# Patient Record
Sex: Female | Born: 1995 | Hispanic: No | Marital: Single | Smoking: Former smoker
Health system: Southern US, Community
[De-identification: ages and names within clinical notes are randomized; demographics above are authoritative.]

## PROBLEM LIST (undated history)

## (undated) ENCOUNTER — Inpatient Hospital Stay (HOSPITAL_COMMUNITY): Payer: Self-pay

## (undated) DIAGNOSIS — Z789 Other specified health status: Secondary | ICD-10-CM

---
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
## 2014-05-22 ENCOUNTER — Encounter (HOSPITAL_COMMUNITY): Payer: Self-pay | Admitting: *Deleted

## 2014-05-22 ENCOUNTER — Emergency Department (HOSPITAL_COMMUNITY)
Admission: EM | Admit: 2014-05-22 | Discharge: 2014-05-22 | Disposition: A | Discharge status: Home / Self Care | Payer: BLUE CROSS/BLUE SHIELD | Attending: Emergency Medicine | Admitting: Emergency Medicine

## 2014-05-22 DIAGNOSIS — N390 Urinary tract infection, site not specified: Secondary | ICD-10-CM | POA: Insufficient documentation

## 2014-05-22 DIAGNOSIS — N939 Abnormal uterine and vaginal bleeding, unspecified: Secondary | ICD-10-CM

## 2014-05-22 DIAGNOSIS — Z3202 Encounter for pregnancy test, result negative: Secondary | ICD-10-CM | POA: Diagnosis not present

## 2014-05-22 DIAGNOSIS — R1030 Lower abdominal pain, unspecified: Secondary | ICD-10-CM | POA: Diagnosis present

## 2014-05-22 DIAGNOSIS — Z87891 Personal history of nicotine dependence: Secondary | ICD-10-CM | POA: Insufficient documentation

## 2014-05-22 DIAGNOSIS — Z792 Long term (current) use of antibiotics: Secondary | ICD-10-CM | POA: Diagnosis not present

## 2014-05-22 LAB — URINALYSIS, ROUTINE W REFLEX MICROSCOPIC
Bilirubin Urine: NEGATIVE
Glucose, UA: NEGATIVE mg/dL
Ketones, ur: NEGATIVE mg/dL
Nitrite: NEGATIVE
PH: 6 (ref 5.0–8.0)
Protein, ur: NEGATIVE mg/dL
SPECIFIC GRAVITY, URINE: 1.026 (ref 1.005–1.030)
Urobilinogen, UA: 0.2 mg/dL (ref 0.0–1.0)

## 2014-05-22 LAB — WET PREP, GENITAL
TRICH WET PREP: NONE SEEN
YEAST WET PREP: NONE SEEN

## 2014-05-22 LAB — POC URINE PREG, ED: Preg Test, Ur: NEGATIVE

## 2014-05-22 LAB — URINE MICROSCOPIC-ADD ON

## 2014-05-22 MED ORDER — CEFTRIAXONE SODIUM 250 MG IJ SOLR
250.0000 mg | Freq: Once | INTRAMUSCULAR | Status: AC
  Administered 2014-05-22: 250 mg via INTRAMUSCULAR
  Filled 2014-05-22: qty 250

## 2014-05-22 MED ORDER — LIDOCAINE HCL (PF) 1 % IJ SOLN
2.0000 mL | Freq: Once | INTRAMUSCULAR | Status: AC
  Administered 2014-05-22: 2 mL

## 2014-05-22 MED ORDER — KETOROLAC TROMETHAMINE 30 MG/ML IJ SOLN
30.0000 mg | Freq: Once | INTRAMUSCULAR | Status: AC
  Administered 2014-05-22: 30 mg via INTRAVENOUS
  Filled 2014-05-22: qty 1

## 2014-05-22 MED ORDER — SODIUM CHLORIDE 0.9 % IV BOLUS (SEPSIS)
1000.0000 mL | INTRAVENOUS | Status: AC
  Administered 2014-05-22: 1000 mL via INTRAVENOUS

## 2014-05-22 MED ORDER — PHENAZOPYRIDINE HCL 200 MG PO TABS: 200.0000 mg | ORAL_TABLET | Freq: Three times a day (TID) | ORAL | Status: DC

## 2014-05-22 MED ORDER — LIDOCAINE HCL (PF) 1 % IJ SOLN
INTRAMUSCULAR | Status: AC
  Filled 2014-05-22: qty 5

## 2014-05-22 MED ORDER — CEPHALEXIN 500 MG PO CAPS: 500.0000 mg | ORAL_CAPSULE | Freq: Four times a day (QID) | ORAL | Status: DC

## 2014-05-22 MED ORDER — AZITHROMYCIN 250 MG PO TABS
1000.0000 mg | ORAL_TABLET | Freq: Once | ORAL | Status: AC
  Administered 2014-05-22: 1000 mg via ORAL
  Filled 2014-05-22: qty 4

## 2014-05-22 MED ORDER — ONDANSETRON HCL 4 MG/2ML IJ SOLN
4.0000 mg | INTRAMUSCULAR | Status: AC
  Administered 2014-05-22: 4 mg via INTRAVENOUS
  Filled 2014-05-22: qty 2

## 2014-05-22 NOTE — ED Provider Notes (Signed)
CSN: [id number]     Arrival date & time 05/22/14  1225 History   First MD Initiated Contact with Patient 05/22/14 1346     Chief Complaint  Patient presents with  . Abdominal Pain   (Consider location/radiation/quality/duration/timing/severity/associated sxs/prior Treatment) HPI  Joanne Espinoza is a 19 yo female presenting with report of abd pain.  She states her pain began about 1 week ago and has been constant, describes it as lower abd pain and rates it currently as a 6/10.  She also reports blood clots noted in urine since the pain began.  Her LMP was at the beginning of the month.  She endorses some dysuria and painful intercourse.  Her bowel movements have been soft and normal but she took a laxative the last 2 days to help with abd pain in case she was constipated.  She subsequently had a large watery stool but the pain has not improved.  She reports some nausea but denies vomiting, vaginal discharge, back pain, fevers or chills.   History reviewed. No pertinent past medical history. History reviewed. No pertinent past surgical history. No family history on file. History  Substance Use Topics  . Smoking status: Former Games developer  . Smokeless tobacco: Never Used  . Alcohol Use: Yes   OB History    No data available     Review of Systems  Constitutional: Negative for fever and chills.  HENT: Negative for sore throat.   Eyes: Negative for visual disturbance.  Respiratory: Negative for cough and shortness of breath.   Cardiovascular: Negative for chest pain and leg swelling.  Gastrointestinal: Positive for nausea and abdominal pain. Negative for vomiting and diarrhea.  Genitourinary: Positive for hematuria and dyspareunia. Negative for dysuria.  Musculoskeletal: Negative for myalgias.  Skin: Negative for rash.  Neurological: Negative for weakness, numbness and headaches.    Allergies  Review of patient's allergies indicates no known allergies.  Home Medications   Prior to  Admission medications   Medication Sig Start Date End Date Taking? Authorizing Provider  cephALEXin (KEFLEX) 500 MG capsule Take 1 capsule (500 mg total) by mouth 4 (four) times daily. 04/06/13   Hannah Muthersbaugh, PA-C  phenazopyridine (PYRIDIUM) 200 MG tablet Take 1 tablet (200 mg total) by mouth 3 (three) times daily as needed for pain. 04/06/13   Hannah Muthersbaugh, PA-C   BP 131/77 mmHg  Pulse 83  Temp(Src) 98.6 F (37 C) (Oral)  Resp 18  Ht  (1.6 m)  Wt 120 lb (54.432 kg)  BMI 21.26 kg/m2  SpO2 100% Physical Exam  Constitutional: She appears well-developed and well-nourished. No distress.  HENT:  Head: Normocephalic and atraumatic.  Mouth/Throat: Oropharynx is clear and moist.  Eyes: Conjunctivae are normal.  Neck: Neck supple.  Cardiovascular: Normal rate, regular rhythm and intact distal pulses.   Pulmonary/Chest: Effort normal and breath sounds normal. No respiratory distress. She has no wheezes. She has no rales. She exhibits no tenderness.  Abdominal: Soft. Normal appearance and bowel sounds are normal. She exhibits no distension and no mass. There is no hepatosplenomegaly. There is tenderness in the suprapubic area. There is no rigidity, no rebound, no guarding, no CVA tenderness, no tenderness at McBurney's point and negative Murphy's sign.    Genitourinary: Cervix exhibits no motion tenderness. Right adnexum displays no tenderness. Left adnexum displays no tenderness. There is bleeding in the vagina.  Bleeding noted in vaginal vault.   Musculoskeletal: She exhibits no tenderness.  Lymphadenopathy:    She has no cervical  adenopathy.  Neurological: She is alert.  Skin: Skin is warm and dry. No rash noted. She is not diaphoretic.  Psychiatric: She has a normal mood and affect.  Nursing note and vitals reviewed.   ED Course  Procedures (including critical care time) Labs Review Labs Reviewed  URINALYSIS, ROUTINE W REFLEX MICROSCOPIC - Abnormal; Notable for the  following:    Hgb urine dipstick MODERATE (*)    Leukocytes, UA SMALL (*)    All other components within normal limits  URINE MICROSCOPIC-ADD ON - Abnormal; Notable for the following:    Squamous Epithelial / LPF FEW (*)    Bacteria, UA FEW (*)    All other components within normal limits  WET PREP, GENITAL  POC URINE PREG, ED  GC/CHLAMYDIA PROBE AMP ([place])    Imaging Review No results found.   EKG Interpretation None      MDM   Final diagnoses:  UTI (lower urinary tract infection)  Vaginal bleeding   19 yo with report of abd pain and hematuria, however there is vaginal blood noted on pelvic exam. Despite report of painful intercourse, no CMT on exam. Her pregnancy test is negative so no concern for ectopic pregnancy. She has 11-20 WBC in her urine so a prescription for abx for UTI is provided, however she was also treated with azithromycin and rocephin due to wet prep with increased WBCs. Pt not concerning for PID because hemodynamically stable and no cervical motion tenderness on pelvic exam. Patient to be discharged with instructions to follow up with OBGYN. Discussed importance of using protection when sexually active. Pt understands that they have GC/Chlamydia cultures pending and that they will need to inform all sexual partners if results return positive. Pt is well-appearing, in no acute distress and vital signs reviewed and not concerning. She appears safe to be discharged.  Return precautions provided. Pt aware of plan and in agreement.    Filed Vitals:   05/22/14 1259 05/22/14 1418 05/22/14 1545 05/22/14 1600  BP: 131/77 133/77 111/76 110/64  Pulse: 83 89 81 154  Temp: 98.6 F (37 C)     TempSrc: Oral     Resp: 18 18    Height: 5\' 3"  (1.6 m)     Weight: 120 lb (54.432 kg)     SpO2: 100% 100% 100% 100%   Meds given in ED:  Medications  sodium chloride 0.9 % bolus 1,000 mL (0 mLs Intravenous Stopped 05/22/14 1554)  ketorolac (TORADOL) 30 MG/ML injection 30  mg (30 mg Intravenous Given 05/22/14 1431)  ondansetron (ZOFRAN) injection 4 mg (4 mg Intravenous Given 05/22/14 1431)  cefTRIAXone (ROCEPHIN) injection 250 mg (250 mg Intramuscular Given 05/22/14 1550)  azithromycin (ZITHROMAX) tablet 1,000 mg (1,000 mg Oral Given 05/22/14 1548)  lidocaine (PF) (XYLOCAINE) 1 % injection 2 mL (2 mLs Other Given 05/22/14 1552)    Discharge Medication List as of 05/22/2014  4:24 PM         Harle BattiestElizabeth Beckham Buxbaum, NP 05/24/14 0234  Richardean Canalavid H Yao, MD 05/25/14 1046

## 2014-05-22 NOTE — ED Notes (Signed)
Pt state that she has had abdominal pain, blood clots with urination. Pt denies being on period or vaginal discharge. Pt reports unprotected sex recently.

## 2014-05-22 NOTE — ED Notes (Signed)
Pelvic cart set up at bedside. Pt undressed from wast down.

## 2014-05-22 NOTE — Discharge Instructions (Signed)
Please follow directions provided. Be sure to call and make an appointment to follow-up with the women's outpatient clinic for further evaluation. Please take your antibiotic until they're all gone to treat the infection in your urine. Be sure to drink clear fluids to stay well hydrated. Don't hesitate to return for any new, worsening, or concerning symptoms.   SEEK IMMEDIATE MEDICAL CARE IF:  You have severe back pain or lower abdominal pain.  You develop chills.  You have nausea or vomiting.  You have continued burning or discomfort with urination.

## 2014-05-23 LAB — GC/CHLAMYDIA PROBE AMP (~~LOC~~) NOT AT ARMC
CHLAMYDIA, DNA PROBE: POSITIVE — AB
NEISSERIA GONORRHEA: NEGATIVE

## 2014-05-24 ENCOUNTER — Telehealth (HOSPITAL_BASED_OUTPATIENT_CLINIC_OR_DEPARTMENT_OTHER): Payer: Self-pay | Admitting: Emergency Medicine

## 2014-05-24 LAB — URINE CULTURE
Colony Count: 40000
Special Requests: NORMAL

## 2014-05-26 ENCOUNTER — Telehealth (HOSPITAL_BASED_OUTPATIENT_CLINIC_OR_DEPARTMENT_OTHER): Payer: Self-pay | Admitting: Emergency Medicine

## 2014-06-13 ENCOUNTER — Telehealth (HOSPITAL_BASED_OUTPATIENT_CLINIC_OR_DEPARTMENT_OTHER): Payer: Self-pay | Admitting: Emergency Medicine

## 2014-06-13 NOTE — Telephone Encounter (Signed)
Lost to followup 

## 2014-08-30 ENCOUNTER — Emergency Department (HOSPITAL_COMMUNITY)
Admission: EM | Admit: 2014-08-30 | Discharge: 2014-08-31 | Disposition: A | Discharge status: Home / Self Care | Payer: BLUE CROSS/BLUE SHIELD | Attending: Emergency Medicine | Admitting: Emergency Medicine

## 2014-08-30 ENCOUNTER — Encounter (HOSPITAL_COMMUNITY): Payer: Self-pay | Admitting: Emergency Medicine

## 2014-08-30 DIAGNOSIS — Z87891 Personal history of nicotine dependence: Secondary | ICD-10-CM | POA: Insufficient documentation

## 2014-08-30 DIAGNOSIS — O2341 Unspecified infection of urinary tract in pregnancy, first trimester: Secondary | ICD-10-CM | POA: Diagnosis not present

## 2014-08-30 DIAGNOSIS — Z202 Contact with and (suspected) exposure to infections with a predominantly sexual mode of transmission: Secondary | ICD-10-CM | POA: Insufficient documentation

## 2014-08-30 DIAGNOSIS — Z3A11 11 weeks gestation of pregnancy: Secondary | ICD-10-CM | POA: Diagnosis not present

## 2014-08-30 DIAGNOSIS — O9989 Other specified diseases and conditions complicating pregnancy, childbirth and the puerperium: Secondary | ICD-10-CM | POA: Diagnosis present

## 2014-08-30 DIAGNOSIS — Z79899 Other long term (current) drug therapy: Secondary | ICD-10-CM | POA: Insufficient documentation

## 2014-08-30 DIAGNOSIS — N390 Urinary tract infection, site not specified: Secondary | ICD-10-CM

## 2014-08-30 DIAGNOSIS — O2342 Unspecified infection of urinary tract in pregnancy, second trimester: Secondary | ICD-10-CM

## 2014-08-30 LAB — URINALYSIS, ROUTINE W REFLEX MICROSCOPIC
Bilirubin Urine: NEGATIVE
Glucose, UA: NEGATIVE mg/dL
Hgb urine dipstick: NEGATIVE
Ketones, ur: NEGATIVE mg/dL
Nitrite: NEGATIVE
Protein, ur: NEGATIVE mg/dL
Specific Gravity, Urine: 1.027 (ref 1.005–1.030)
Urobilinogen, UA: 0.2 mg/dL (ref 0.0–1.0)
pH: 7.5 (ref 5.0–8.0)

## 2014-08-30 LAB — WET PREP, GENITAL
Trich, Wet Prep: NONE SEEN
Yeast Wet Prep HPF POC: NONE SEEN

## 2014-08-30 LAB — URINE MICROSCOPIC-ADD ON

## 2014-08-30 MED ORDER — CEPHALEXIN 500 MG PO CAPS: 1000.0000 mg | ORAL_CAPSULE | Freq: Two times a day (BID) | ORAL | Status: DC

## 2014-08-30 NOTE — ED Notes (Signed)
Pelvic cart at bedside. 

## 2014-08-30 NOTE — ED Notes (Signed)
Pt arrived to the ED with a complaint of an sexually transmitted disease.  Pt states she is three month pregnant and went to the doctors a week ago and was told that she had chlamydia.  Pt was advised to seek a second opinion.  Pt has no symptoms at this time.

## 2014-08-30 NOTE — ED Provider Notes (Signed)
CSN: [id number]     Arrival date & time 08/30/14  2008 History   First MD Initiated Contact with Patient 08/30/14 2136     Chief Complaint  Patient presents with  . SEXUALLY TRANSMITTED DISEASE     (Consider location/radiation/quality/duration/timing/severity/associated sxs/prior Treatment) HPI   19 year old G1 P0 pregnant female presenting with concern of STD. Patient states she is 3 months pregnant, last menstrual period was May 13. She does have an OB/GYN. She has had a formal ultrasound confirming IUP according to patient. 4 days ago she was seen at her OB/GYN office for a regular checkup. She was told that she was diagnosed with Chlamydia. She was given antibiotic treatment. Patient states she has no symptoms and she is requesting for a second opinion. She denies having fever, chills, chest pain, shortness of breath, abdominal pain, back pain, dysuria, hematuria, vaginal bleeding, vaginal discharge, or rash. No prior history of STDs. Denies any pain with sexual activities.  No past medical history on file. No past surgical history on file. No family history on file. History  Substance Use Topics  . Smoking status: Former Games developer  . Smokeless tobacco: Never Used  . Alcohol Use: Yes   OB History    Gravida Para Term Preterm AB TAB SAB Ectopic Multiple Living   1              Review of Systems  All other systems reviewed and are negative.     Allergies  Review of patient's allergies indicates no known allergies.  Home Medications   Prior to Admission medications   Medication Sig Start Date End Date Taking? Authorizing Provider  prenatal vitamin w/FE, FA (PRENATAL 1 + 1) 27-1 MG TABS tablet Take 1 tablet by mouth daily at 12 noon.   Yes Historical Provider, MD  cephALEXin (KEFLEX) 500 MG capsule Take 1 capsule (500 mg total) by mouth 4 (four) times daily. Patient not taking: Reported on 08/30/2014 05/22/14   Harle Battiest, NP  phenazopyridine (PYRIDIUM) 200 MG tablet  Take 1 tablet (200 mg total) by mouth 3 (three) times daily. Patient not taking: Reported on 08/30/2014 05/22/14   Harle Battiest, NP   Ht  (1.6 m)  Wt 126 lb (57.153 kg)  BMI 22.33 kg/m2 Physical Exam  Constitutional: She appears well-developed and well-nourished. No distress.  HENT:  Head: Atraumatic.  Eyes: Conjunctivae are normal.  Neck: Neck supple.  Genitourinary:  Chaperone present during exam. No inguinal lymphadenopathy or inguinal hernia noted. Normal external genitalia. Mild discomfort with speculum insertion. Functional vaginal discharge noted in vaginal vault. Closed cervical os free of lesion. On bimanual examination, no adnexal tenderness or cervical motion tenderness. No mass.  Neurological: She is alert.  Skin: No rash noted.  Psychiatric: She has a normal mood and affect.  Nursing note and vitals reviewed.   ED Course  Procedures (including critical care time)  Patient here requesting for second opinion of recently diagnosed to chlamydial infection treated with antibiotic. I explained to patient that chlamydial infection is usually asymptomatic. However patient continued to request for further STD check.  11:44 PM No evidence of PID on pelvic examination. Her urine shows small leukocytes, 11-20 WBC, and a few bacteria. Since patient is pregnant, and to treat for asymptomatic bacteriuria. Wet prep with may WBC and a few clue cells. Patient has already received treatment for chlamydia infection. Culture sent. Recommend patient to follow-up with her OB/GYN for further care.   Labs Review Labs Reviewed  WET PREP,  GENITAL - Abnormal; Notable for the following:    Clue Cells Wet Prep HPF POC FEW (*)    WBC, Wet Prep HPF POC MANY (*)    All other components within normal limits  URINALYSIS, ROUTINE W REFLEX MICROSCOPIC (NOT AT [facility]) - Abnormal; Notable for the following:    APPearance CLOUDY (*)    Leukocytes, UA SMALL (*)    All other components within normal  limits  URINE MICROSCOPIC-ADD ON - Abnormal; Notable for the following:    Squamous Epithelial / LPF FEW (*)    Bacteria, UA FEW (*)    All other components within normal limits  RPR  HIV ANTIBODY (ROUTINE TESTING)  HCG, QUANTITATIVE, PREGNANCY  GC/CHLAMYDIA PROBE AMP ([place]) NOT AT [facility]    Imaging Review No results found.   EKG Interpretation None      MDM   Final diagnoses:  Asymptomatic bacteriuria during pregnancy, second trimester    BP 116/58 mmHg  Pulse 73  Temp(Src) 97.9 F (36.6 C) (Oral)  Resp 16  Ht 5\' 3"  (1.6 m)  Wt 126 lb (57.153 kg)  BMI 22.33 kg/m2  SpO2 100%     Fayrene Helper, PA-C 08/30/14 2346  Toy Cookey, MD 08/31/14 0004

## 2014-08-30 NOTE — Discharge Instructions (Signed)
Pregnancy and Urinary Tract Infection °A urinary tract infection (UTI) is a bacterial infection of the urinary tract. Infection of the urinary tract can include the ureters, kidneys (pyelonephritis), bladder (cystitis), and urethra (urethritis). All pregnant women should be screened for bacteria in the urinary tract. Identifying and treating a UTI will decrease the risk of preterm labor and developing more serious infections in both the mother and baby. °CAUSES °Bacteria germs cause almost all UTIs.  °RISK FACTORS °Many factors can increase your chances of getting a UTI during pregnancy. These include: °· Having a short urethra. °· Poor toilet and hygiene habits. °· Sexual intercourse. °· Blockage of urine along the urinary tract. °· Problems with the pelvic muscles or nerves. °· Diabetes. °· Obesity. °· Bladder problems after having several children. °· Previous history of UTI. °SIGNS AND SYMPTOMS  °· Pain, burning, or a stinging feeling when urinating. °· Suddenly feeling the need to urinate right away (urgency). °· Loss of bladder control (urinary incontinence). °· Frequent urination, more than is common with pregnancy. °· Lower abdominal or back discomfort. °· Cloudy urine. °· Blood in the urine (hematuria). °· Fever.  °When the kidneys are infected, the symptoms may be: °· Back pain. °· Flank pain on the right side more so than the left. °· Fever. °· Chills. °· Nausea. °· Vomiting. °DIAGNOSIS  °A urinary tract infection is usually diagnosed through urine tests. Additional tests and procedures are sometimes done. These may include: °· Ultrasound exam of the kidneys, ureters, bladder, and urethra. °· Looking in the bladder with a lighted tube (cystoscopy). °TREATMENT °Typically, UTIs can be treated with antibiotic medicines.  °HOME CARE INSTRUCTIONS  °· Only take over-the-counter or prescription medicines as directed by your health care provider. If you were prescribed antibiotics, take them as directed. Finish  them even if you start to feel better. °· Drink enough fluids to keep your urine clear or pale yellow. °· Do not have sexual intercourse until the infection is gone and your health care provider says it is okay. °· Make sure you are tested for UTIs throughout your pregnancy. These infections often come back.  °Preventing a UTI in the Future °· Practice good toilet habits. Always wipe from front to back. Use the tissue only once. °· Do not hold your urine. Empty your bladder as soon as possible when the urge comes. °· Do not douche or use deodorant sprays. °· Wash with soap and warm water around the genital area and the anus. °· Empty your bladder before and after sexual intercourse. °· Wear underwear with a cotton crotch. °· Avoid caffeine and carbonated drinks. They can irritate the bladder. °· Drink cranberry juice or take cranberry pills. This may decrease the risk of getting a UTI. °· Do not drink alcohol. °· Keep all your appointments and tests as scheduled.  °SEEK MEDICAL CARE IF:  °· Your symptoms get worse. °· You are still having fevers 2 or more days after treatment begins. °· You have a rash. °· You feel that you are having problems with medicines prescribed. °· You have abnormal vaginal discharge. °SEEK IMMEDIATE MEDICAL CARE IF:  °· You have back or flank pain. °· You have chills. °· You have blood in your urine. °· You have nausea and vomiting. °· You have contractions of your uterus. °· You have a gush of fluid from the vagina. °MAKE SURE YOU: °· Understand these instructions.   °· Will watch your condition.   °· Will get help right away if you are not doing   well or get worse.   °Document Released: 05/16/2010 Document Revised: 11/09/2012 Document Reviewed: 08/18/2012 °[employer]® Patient Information ©2015 [employer]. This information is not intended to replace advice given to you by your health care provider. Make sure you discuss any questions you have with your health care provider. ° °

## 2014-08-31 LAB — HCG, QUANTITATIVE, PREGNANCY: hCG, Beta Chain, Quant, S: 121006 m[IU]/mL — ABNORMAL HIGH (ref <5)

## 2014-08-31 LAB — GC/CHLAMYDIA PROBE AMP (~~LOC~~) NOT AT ARMC
Chlamydia: POSITIVE — AB
NEISSERIA GONORRHEA: NEGATIVE

## 2014-08-31 LAB — HIV ANTIBODY (ROUTINE TESTING W REFLEX): HIV Screen 4th Generation wRfx: NONREACTIVE

## 2014-08-31 LAB — RPR: RPR Ser Ql: NONREACTIVE

## 2014-09-03 ENCOUNTER — Telehealth (HOSPITAL_COMMUNITY): Payer: Self-pay

## 2014-09-03 NOTE — Telephone Encounter (Signed)
Positive for chlamydia. Chart sent to edp office for review 

## 2014-09-05 ENCOUNTER — Telehealth (HOSPITAL_COMMUNITY): Payer: Self-pay

## 2014-09-05 NOTE — Telephone Encounter (Signed)
Cart reviewed by Dr Silverio Lay "Azithromycin 1 gram po x 1" Partner needs treatment" 09/05/2014 @ 13:56 LVM requesting callback.

## 2014-09-06 ENCOUNTER — Telehealth (HOSPITAL_COMMUNITY): Payer: Self-pay

## 2014-09-09 ENCOUNTER — Telehealth (HOSPITAL_COMMUNITY): Payer: Self-pay | Admitting: Emergency Medicine

## 2014-09-09 NOTE — Telephone Encounter (Signed)
Post ED Visit - Positive Culture Follow-up: Unsuccessful Patient Follow-up   Positive Chlamydia culture   Patient discharged without antimicrobial prescription and treatment is now indicated  Organism is resistant to prescribed ED discharge antimicrobial  Patient with positive blood cultures   Unable to contact patient after 3 attempts, letter will be sent to address on file  Joanne Espinoza 09/09/2014, 6:19 PM

## 2014-09-24 ENCOUNTER — Telehealth (HOSPITAL_COMMUNITY): Payer: Self-pay

## 2014-09-24 NOTE — Telephone Encounter (Signed)
Unable to contact pt by mail or telephone. Unable to communicate lab results or treatment changes. 

## 2015-06-24 ENCOUNTER — Telehealth (HOSPITAL_BASED_OUTPATIENT_CLINIC_OR_DEPARTMENT_OTHER): Payer: Self-pay | Admitting: Emergency Medicine

## 2015-06-24 NOTE — Telephone Encounter (Signed)
Letter returned, no known forwarding address,, lost to sender

## 2016-01-01 ENCOUNTER — Encounter (HOSPITAL_COMMUNITY): Payer: Self-pay | Admitting: *Deleted

## 2016-01-01 ENCOUNTER — Inpatient Hospital Stay (HOSPITAL_COMMUNITY)
Admission: AD | Admit: 2016-01-01 | Discharge: 2016-01-01 | Disposition: A | Discharge status: Home / Self Care | Payer: Medicaid Other | Source: Ambulatory Visit | Attending: Obstetrics & Gynecology | Admitting: Obstetrics & Gynecology

## 2016-01-01 ENCOUNTER — Inpatient Hospital Stay (HOSPITAL_COMMUNITY): Payer: Medicaid Other

## 2016-01-01 DIAGNOSIS — Z3A01 Less than 8 weeks gestation of pregnancy: Secondary | ICD-10-CM | POA: Diagnosis not present

## 2016-01-01 DIAGNOSIS — R102 Pelvic and perineal pain: Secondary | ICD-10-CM | POA: Insufficient documentation

## 2016-01-01 DIAGNOSIS — Z87891 Personal history of nicotine dependence: Secondary | ICD-10-CM | POA: Insufficient documentation

## 2016-01-01 DIAGNOSIS — Z3201 Encounter for pregnancy test, result positive: Secondary | ICD-10-CM | POA: Insufficient documentation

## 2016-01-01 DIAGNOSIS — O26891 Other specified pregnancy related conditions, first trimester: Secondary | ICD-10-CM | POA: Diagnosis present

## 2016-01-01 DIAGNOSIS — Z3490 Encounter for supervision of normal pregnancy, unspecified, unspecified trimester: Secondary | ICD-10-CM

## 2016-01-01 LAB — CBC
HCT: 37.2 % (ref 36.0–46.0)
HEMOGLOBIN: 12.8 g/dL (ref 12.0–15.0)
MCH: 32.4 pg (ref 26.0–34.0)
MCHC: 34.4 g/dL (ref 30.0–36.0)
MCV: 94.2 fL (ref 78.0–100.0)
Platelets: 317 10*3/uL (ref 150–400)
RBC: 3.95 MIL/uL (ref 3.87–5.11)
RDW: 13.3 % (ref 11.5–15.5)
WBC: 7.7 10*3/uL (ref 4.0–10.5)

## 2016-01-01 LAB — URINALYSIS, ROUTINE W REFLEX MICROSCOPIC
Bilirubin Urine: NEGATIVE
Glucose, UA: NEGATIVE mg/dL
Ketones, ur: NEGATIVE mg/dL
LEUKOCYTES UA: NEGATIVE
Nitrite: NEGATIVE
Protein, ur: NEGATIVE mg/dL
pH: 5 (ref 5.0–8.0)

## 2016-01-01 LAB — WET PREP, GENITAL
Clue Cells Wet Prep HPF POC: NONE SEEN
SPERM: NONE SEEN
Trich, Wet Prep: NONE SEEN
YEAST WET PREP: NONE SEEN

## 2016-01-01 LAB — HCG, QUANTITATIVE, PREGNANCY: hCG, Beta Chain, Quant, S: 120606 m[IU]/mL — ABNORMAL HIGH (ref <5)

## 2016-01-01 LAB — URINE MICROSCOPIC-ADD ON

## 2016-01-01 LAB — POCT PREGNANCY, URINE: Preg Test, Ur: POSITIVE — AB

## 2016-01-01 NOTE — Discharge Instructions (Signed)
First Trimester of Pregnancy °The first trimester of pregnancy is from week 1 until the end of week 12 (months 1 through 3). A week after a sperm fertilizes an egg, the egg will implant on the wall of the uterus. This embryo will begin to develop into a baby. Genes from you and your partner are forming the baby. The female genes determine whether the baby is a boy or a girl. At 6-8 weeks, the eyes and face are formed, and the heartbeat can be seen on ultrasound. At the end of 12 weeks, all the baby's organs are formed.  °Now that you are pregnant, you will want to do everything you can to have a healthy baby. Two of the most important things are to get good prenatal care and to follow your health care provider's instructions. Prenatal care is all the medical care you receive before the baby's birth. This care will help prevent, find, and treat any problems during the pregnancy and childbirth. °BODY CHANGES °Your body goes through many changes during pregnancy. The changes vary from woman to woman.  °· You may gain or lose a couple of pounds at first. °· You may feel sick to your stomach (nauseous) and throw up (vomit). If the vomiting is uncontrollable, call your health care provider. °· You may tire easily. °· You may develop headaches that can be relieved by medicines approved by your health care provider. °· You may urinate more often. Painful urination may mean you have a bladder infection. °· You may develop heartburn as a result of your pregnancy. °· You may develop constipation because certain hormones are causing the muscles that push waste through your intestines to slow down. °· You may develop hemorrhoids or swollen, bulging veins (varicose veins). °· Your breasts may begin to grow larger and become tender. Your nipples may stick out more, and the tissue that surrounds them (areola) may become darker. °· Your gums may bleed and may be sensitive to brushing and flossing. °· Dark spots or blotches (chloasma,  mask of pregnancy) may develop on your face. This will likely fade after the baby is born. °· Your menstrual periods will stop. °· You may have a loss of appetite. °· You may develop cravings for certain kinds of food. °· You may have changes in your emotions from day to day, such as being excited to be pregnant or being concerned that something may go wrong with the pregnancy and baby. °· You may have more vivid and strange dreams. °· You may have changes in your hair. These can include thickening of your hair, rapid growth, and changes in texture. Some women also have hair loss during or after pregnancy, or hair that feels dry or thin. Your hair will most likely return to normal after your baby is born. °WHAT TO EXPECT AT YOUR PRENATAL VISITS °During a routine prenatal visit: °· You will be weighed to make sure you and the baby are growing normally. °· Your blood pressure will be taken. °· Your abdomen will be measured to track your baby's growth. °· The fetal heartbeat will be listened to starting around week 10 or 12 of your pregnancy. °· Test results from any previous visits will be discussed. °Your health care provider may ask you: °· How you are feeling. °· If you are feeling the baby move. °· If you have had any abnormal symptoms, such as leaking fluid, bleeding, severe headaches, or abdominal cramping. °· If you are using any tobacco products,   including cigarettes, chewing tobacco, and electronic cigarettes. °· If you have any questions. °Other tests that may be performed during your first trimester include: °· Blood tests to find your blood type and to check for the presence of any previous infections. They will also be used to check for low iron levels (anemia) and Rh antibodies. Later in the pregnancy, blood tests for diabetes will be done along with other tests if problems develop. °· Urine tests to check for infections, diabetes, or protein in the urine. °· An ultrasound to confirm the proper growth  and development of the baby. °· An amniocentesis to check for possible genetic problems. °· Fetal screens for spina bifida and Down syndrome. °· You may need other tests to make sure you and the baby are doing well. °· HIV (human immunodeficiency virus) testing. Routine prenatal testing includes screening for HIV, unless you choose not to have this test. °HOME CARE INSTRUCTIONS  °Medicines  °· Follow your health care provider's instructions regarding medicine use. Specific medicines may be either safe or unsafe to take during pregnancy. °· Take your prenatal vitamins as directed. °· If you develop constipation, try taking a stool softener if your health care provider approves. °Diet  °· Eat regular, well-balanced meals. Choose a variety of foods, such as meat or vegetable-based protein, fish, milk and low-fat dairy products, vegetables, fruits, and whole grain breads and cereals. Your health care provider will help you determine the amount of weight gain that is right for you. °· Avoid raw meat and uncooked cheese. These carry germs that can cause birth defects in the baby. °· Eating four or five small meals rather than three large meals a day may help relieve nausea and vomiting. If you start to feel nauseous, eating a few soda crackers can be helpful. Drinking liquids between meals instead of during meals also seems to help nausea and vomiting. °· If you develop constipation, eat more high-fiber foods, such as fresh vegetables or fruit and whole grains. Drink enough fluids to keep your urine clear or pale yellow. °Activity and Exercise  °· Exercise only as directed by your health care provider. Exercising will help you: °¨ Control your weight. °¨ Stay in shape. °¨ Be prepared for labor and delivery. °· Experiencing pain or cramping in the lower abdomen or low back is a good sign that you should stop exercising. Check with your health care provider before continuing normal exercises. °· Try to avoid standing for  long periods of time. Move your legs often if you must stand in one place for a long time. °· Avoid heavy lifting. °· Wear low-heeled shoes, and practice good posture. °· You may continue to have sex unless your health care provider directs you otherwise. °Relief of Pain or Discomfort  °· Wear a good support bra for breast tenderness.   °· Take warm sitz baths to soothe any pain or discomfort caused by hemorrhoids. Use hemorrhoid cream if your health care provider approves.   °· Rest with your legs elevated if you have leg cramps or low back pain. °· If you develop varicose veins in your legs, wear support hose. Elevate your feet for 15 minutes, 3-4 times a day. Limit salt in your diet. °Prenatal Care  °· Schedule your prenatal visits by the twelfth week of pregnancy. They are usually scheduled monthly at first, then more often in the last 2 months before delivery. °· Write down your questions. Take them to your prenatal visits. °· Keep all your prenatal   visits as directed by your health care provider. °Safety  °· Wear your seat belt at all times when driving. °· Make a list of emergency phone numbers, including numbers for family, friends, the hospital, and police and fire departments. °General Tips  °· Ask your health care provider for a referral to a local prenatal education class. Begin classes no later than at the beginning of month 6 of your pregnancy. °· Ask for help if you have counseling or nutritional needs during pregnancy. Your health care provider can offer advice or refer you to specialists for help with various needs. °· Do not use hot tubs, steam rooms, or saunas. °· Do not douche or use tampons or scented sanitary pads. °· Do not cross your legs for long periods of time. °· Avoid cat litter boxes and soil used by cats. These carry germs that can cause birth defects in the baby and possibly loss of the fetus by miscarriage or stillbirth. °· Avoid all smoking, herbs, alcohol, and medicines not  prescribed by your health care provider. Chemicals in these affect the formation and growth of the baby. °· Do not use any tobacco products, including cigarettes, chewing tobacco, and electronic cigarettes. If you need help quitting, ask your health care provider. You may receive counseling support and other resources to help you quit. °· Schedule a dentist appointment. At home, brush your teeth with a soft toothbrush and be gentle when you floss. °SEEK MEDICAL CARE IF:  °· You have dizziness. °· You have mild pelvic cramps, pelvic pressure, or nagging pain in the abdominal area. °· You have persistent nausea, vomiting, or diarrhea. °· You have a bad smelling vaginal discharge. °· You have pain with urination. °· You notice increased swelling in your face, hands, legs, or ankles. °SEEK IMMEDIATE MEDICAL CARE IF:  °· You have a fever. °· You are leaking fluid from your vagina. °· You have spotting or bleeding from your vagina. °· You have severe abdominal cramping or pain. °· You have rapid weight gain or loss. °· You vomit blood or material that looks like coffee grounds. °· You are exposed to German measles and have never had them. °· You are exposed to fifth disease or chickenpox. °· You develop a severe headache. °· You have shortness of breath. °· You have any kind of trauma, such as from a fall or a car accident. °This information is not intended to replace advice given to you by your health care provider. Make sure you discuss any questions you have with your health care provider. °Document Released: 01/13/2001 Document Revised: 02/09/2014 Document Reviewed: 11/29/2012 °[employer]vier Interactive Patient Education © 2017 [employer] ° °Safe Medications in Pregnancy  ° °Acne: °Benzoyl Peroxide °Salicylic Acid ° °Backache/Headache: °Tylenol: 2 regular strength every 4 hours OR °             2 Extra strength every 6 hours ° °Colds/Coughs/Allergies: °Benadryl (alcohol free) 25 mg every 6 hours as needed °Breath right  strips °Claritin °Cepacol throat lozenges °Chloraseptic throat spray °Cold-Eeze- up to three times per day °Cough drops, alcohol free °Flonase (by prescription only) °Guaifenesin °Mucinex °Robitussin DM (plain only, alcohol free) °Saline nasal spray/drops °Sudafed (pseudoephedrine) & Actifed ** use only after [redacted] weeks gestation and if you do not have high blood pressure °Tylenol °Vicks Vaporub °Zinc lozenges °Zyrtec  ° °Constipation: °Colace °Ducolax suppositories °Fleet enema °Glycerin suppositories °Metamucil °Milk of magnesia °Miralax °Senokot °Smooth move tea ° °Diarrhea: °Kaopectate °Imodium A-D ° °*NO pepto Bismol ° °  Hemorrhoids: °Anusol °Anusol HC °Preparation H °Tucks ° °Indigestion: °Tums °Maalox °Mylanta °Zantac  °Pepcid ° °Insomnia: °Benadryl (alcohol free) 25mg every 6 hours as needed °Tylenol PM °Unisom, no Gelcaps ° °Leg Cramps: °Tums °MagGel ° °Nausea/Vomiting:  °Bonine °Dramamine °Emetrol °Ginger extract °Sea bands °Meclizine  °Nausea medication to take during pregnancy:  °Unisom (doxylamine succinate 25 mg tablets) Take one tablet daily at bedtime. If symptoms are not adequately controlled, the dose can be increased to a maximum recommended dose of two tablets daily (1/2 tablet in the morning, 1/2 tablet mid-afternoon and one at bedtime). °Vitamin B6 100mg tablets. Take one tablet twice a day (up to 200 mg per day). ° °Skin Rashes: °Aveeno products °Benadryl cream or 25mg every 6 hours as needed °Calamine Lotion °1% cortisone cream ° °Yeast infection: °Gyne-lotrimin 7 °Monistat 7 ° ° °**If taking multiple medications, please check labels to avoid duplicating the same active ingredients °**take medication as directed on the label °** Do not exceed 4000 mg of tylenol in 24 hours °**Do not take medications that contain aspirin or ibuprofen ° ° ° ° ° °

## 2016-01-01 NOTE — MAU Provider Note (Signed)
History     CSN: [id number]  Arrival date and time: 01/01/16 [id number]   First Provider Initiated Contact with Patient 01/01/16 2102      Chief Complaint  Patient presents with  . Pelvic Pain   Pelvic Pain  The patient's primary symptoms include pelvic pain. This is a new problem. The current episode started in the past 7 days. The problem occurs constantly. The problem has been unchanged. Pain severity now: 3/10  The problem affects the left side. She is pregnant. Associated symptoms include abdominal pain, headaches, nausea and vomiting. Pertinent negatives include no chills, constipation, diarrhea, dysuria, fever, frequency or urgency. The vaginal discharge was normal. There has been no bleeding. Nothing aggravates the symptoms. She has tried nothing for the symptoms. She is sexually active. She uses nothing for contraception. Her menstrual history has been regular (LMP 11/14/15 approx).    History reviewed. No pertinent past medical history.  Past Surgical History:  Procedure Laterality Date  . CESAREAN SECTION      History reviewed. No pertinent family history.  Social History  Substance Use Topics  . Smoking status: Former Games developermoker  . Smokeless tobacco: Never Used  . Alcohol use Yes    Allergies: No Known Allergies  Prescriptions Prior to Admission  Medication Sig Dispense Refill Last Dose  . phenazopyridine (PYRIDIUM) 200 MG tablet Take 1 tablet (200 mg total) by mouth 3 (three) times daily. (Patient not taking: Reported on 08/30/2014) 6 tablet 0 Completed Course at Unknown time    Review of Systems  Constitutional: Negative for chills and fever.  Gastrointestinal: Positive for abdominal pain, nausea and vomiting. Negative for constipation and diarrhea.  Genitourinary: Positive for pelvic pain. Negative for dysuria, frequency and urgency.  Neurological: Positive for headaches.   Physical Exam   Blood pressure 122/65, pulse 69, temperature 98.5 F (36.9 C),  temperature source Oral, resp. rate (!) 71, height 5\' 3"  (1.6 m), weight 69.4 kg (153 lb), last menstrual period 11/14/2015, SpO2 100 %, unknown if currently breastfeeding.  Physical Exam  Nursing note and vitals reviewed. Constitutional: She is oriented to person, place, and time. She appears well-developed and well-nourished. No distress.  HENT:  Head: Normocephalic.  Cardiovascular: Normal rate.   Respiratory: Effort normal.  GI: Soft. There is no tenderness. There is no rebound.  Neurological: She is alert and oriented to person, place, and time.  Skin: Skin is warm and dry.  Psychiatric: She has a normal mood and affect.     Results for orders placed or performed during the hospital encounter of 01/01/16 (from the past 24 hour(s))  Urinalysis, Routine w reflex microscopic ([facility])     Status: Abnormal   Collection Time: 01/01/16  7:36 PM  Result Value Ref Range   Color, Urine YELLOW YELLOW   APPearance CLOUDY (A) CLEAR   Specific Gravity, Urine >1.030 (H) 1.005 - 1.030   pH 5.0 5.0 - 8.0   Glucose, UA NEGATIVE NEGATIVE mg/dL   Hgb urine dipstick TRACE (A) NEGATIVE   Bilirubin Urine NEGATIVE NEGATIVE   Ketones, ur NEGATIVE NEGATIVE mg/dL   Protein, ur NEGATIVE NEGATIVE mg/dL   Nitrite NEGATIVE NEGATIVE   Leukocytes, UA NEGATIVE NEGATIVE  Urine microscopic-add on     Status: Abnormal   Collection Time: 01/01/16  7:36 PM  Result Value Ref Range   Squamous Epithelial / LPF 0-5 (A) NONE SEEN   WBC, UA 0-5 0 - 5 WBC/hpf   RBC / HPF 0-5 0 - 5 RBC/hpf  Bacteria, UA RARE (A) NONE SEEN   Urine-Other AMORPHOUS URATES/PHOSPHATES   Pregnancy, urine POC     Status: Abnormal   Collection Time: 01/01/16  7:38 PM  Result Value Ref Range   Preg Test, Ur POSITIVE (A) NEGATIVE  CBC     Status: None   Collection Time: 01/01/16  9:15 PM  Result Value Ref Range   WBC 7.7 4.0 - 10.5 K/uL   RBC 3.95 3.87 - 5.11 MIL/uL   Hemoglobin 12.8 12.0 - 15.0 g/dL   HCT 16.137.2 09.636.0 - 04.546.0 %    MCV 94.2 78.0 - 100.0 fL   MCH 32.4 26.0 - 34.0 pg   MCHC 34.4 30.0 - 36.0 g/dL   RDW 40.913.3 81.111.5 - 91.415.5 %   Platelets 317 150 - 400 K/uL  hCG, quantitative, pregnancy     Status: Abnormal   Collection Time: 01/01/16  9:15 PM  Result Value Ref Range   hCG, Beta Chain, Quant, S 120,606 (H) <5 mIU/mL  ABO/Rh     Status: None (Preliminary result)   Collection Time: 01/01/16  9:15 PM  Result Value Ref Range   ABO/RH(D) O POS   Wet prep, genital     Status: Abnormal   Collection Time: 01/01/16  9:22 PM  Result Value Ref Range   Yeast Wet Prep HPF POC NONE SEEN NONE SEEN   Trich, Wet Prep NONE SEEN NONE SEEN   Clue Cells Wet Prep HPF POC NONE SEEN NONE SEEN   WBC, Wet Prep HPF POC FEW (A) NONE SEEN   Sperm NONE SEEN    [place] Ob Comp Less 14 Wks  Result Date: 01/01/2016 CLINICAL DATA:  20 y/o  F; two weeks of pelvic pain. EXAM: OBSTETRIC <14 WK US AND TRANSVAGINAL OB US TECHNIQUE: Both transabdominal and transvaginal ultrasound examinations were performed for complete evaluation of the gestation as well as the maternal uterus, adnexal regions, and pelvic cul-de-sac. Transvaginal technique was performed to assess early pregnancy. COMPARISON:  None. FINDINGS: Intrauterine gestational sac: Single Yolk sac:  Visualized. Embryo:  Visualized. Cardiac Activity: Visualized. Heart Rate: 115  bpm CRL:  3.1  mm   5 w   6 d                  US EDC: 08/27/2016 Subchorionic hemorrhage:  None visualized. Maternal uterus/adnexae: Normal. IMPRESSION: Single live intrauterine pregnancy with estimated gestational age of [redacted] weeks and 6 days by crown-rump length. Electronically Signed   By: Mitzi HansenLance  Furusawa-Stratton M.D.   On: 01/01/2016 22:31   [place] Ob Transvaginal  Result Date: 01/01/2016 CLINICAL DATA:  20 y/o  F; two weeks of pelvic pain. EXAM: OBSTETRIC <14 WK US AND TRANSVAGINAL OB US TECHNIQUE: Both transabdominal and transvaginal ultrasound examinations were performed for complete evaluation of the gestation as  well as the maternal uterus, adnexal regions, and pelvic cul-de-sac. Transvaginal technique was performed to assess early pregnancy. COMPARISON:  None. FINDINGS: Intrauterine gestational sac: Single Yolk sac:  Visualized. Embryo:  Visualized. Cardiac Activity: Visualized. Heart Rate: 115  bpm CRL:  3.1  mm   5 w   6 d                  US EDC: 08/27/2016 Subchorionic hemorrhage:  None visualized. Maternal uterus/adnexae: Normal. IMPRESSION: Single live intrauterine pregnancy with estimated gestational age of [redacted] weeks and 6 days by crown-rump length. Electronically Signed   By: Mitzi HansenLance  Furusawa-Stratton M.D.   On: 01/01/2016 22:31   MAU Course  Procedures  MDM   Assessment and Plan   1. Intrauterine pregnancy   2. Pelvic pain affecting pregnancy in first trimester, antepartum   3. [redacted] weeks gestation of pregnancy    DC home Comfort measures reviewed  1stTrimester precautions  RX: none  Return to MAU as needed FU with OB as planned  Follow-up Information    [facility] Follow up.   Contact information: [address] [place] [address] [phone]            Tawnya Crook 01/01/2016, 9:05 PM

## 2016-01-01 NOTE — MAU Note (Signed)
Pt reports she has had vomiting off/on for 5 days and has had lower abd pain for 2 weeks. LMP 11/14/2015 and has not had a preg test.

## 2016-01-02 LAB — GC/CHLAMYDIA PROBE AMP (~~LOC~~) NOT AT ARMC
Chlamydia: NEGATIVE
NEISSERIA GONORRHEA: NEGATIVE

## 2016-01-02 LAB — HIV ANTIBODY (ROUTINE TESTING W REFLEX): HIV SCREEN 4TH GENERATION: NONREACTIVE

## 2016-01-02 LAB — RPR: RPR Ser Ql: NONREACTIVE

## 2016-01-02 LAB — ABO/RH: ABO/RH(D): O POS

## 2016-01-08 ENCOUNTER — Inpatient Hospital Stay (HOSPITAL_COMMUNITY)
Admission: AD | Admit: 2016-01-08 | Discharge: 2016-01-08 | Disposition: A | Discharge status: Home / Self Care | Payer: Medicaid Other | Source: Ambulatory Visit | Attending: Family Medicine | Admitting: Family Medicine

## 2016-01-08 ENCOUNTER — Encounter (HOSPITAL_COMMUNITY): Payer: Self-pay

## 2016-01-08 DIAGNOSIS — Z87891 Personal history of nicotine dependence: Secondary | ICD-10-CM | POA: Diagnosis not present

## 2016-01-08 DIAGNOSIS — K529 Noninfective gastroenteritis and colitis, unspecified: Secondary | ICD-10-CM

## 2016-01-08 DIAGNOSIS — Z3A01 Less than 8 weeks gestation of pregnancy: Secondary | ICD-10-CM | POA: Diagnosis not present

## 2016-01-08 DIAGNOSIS — O99611 Diseases of the digestive system complicating pregnancy, first trimester: Secondary | ICD-10-CM | POA: Diagnosis not present

## 2016-01-08 DIAGNOSIS — O210 Mild hyperemesis gravidarum: Secondary | ICD-10-CM | POA: Diagnosis present

## 2016-01-08 LAB — URINALYSIS, ROUTINE W REFLEX MICROSCOPIC
BILIRUBIN URINE: NEGATIVE
Glucose, UA: NEGATIVE mg/dL
Ketones, ur: NEGATIVE mg/dL
LEUKOCYTES UA: NEGATIVE
Nitrite: NEGATIVE
PROTEIN: 30 mg/dL — AB
Specific Gravity, Urine: 1.03 (ref 1.005–1.030)
pH: 6 (ref 5.0–8.0)

## 2016-01-08 MED ORDER — PROMETHAZINE HCL 12.5 MG PO TABS: 12.5000 mg | ORAL_TABLET | Freq: Four times a day (QID) | ORAL | 0 refills | Status: DC | PRN

## 2016-01-08 MED ORDER — ONDANSETRON HCL 4 MG PO TABS
4.0000 mg | ORAL_TABLET | Freq: Once | ORAL | Status: AC
  Administered 2016-01-08: 4 mg via ORAL
  Filled 2016-01-08: qty 1

## 2016-01-08 NOTE — MAU Note (Signed)
Been throwing up non-stop since yesterday morning.  Is really hungry, can't keep anything down.  Wants to make sure she is not dehydrated, because she is pregnant

## 2016-01-08 NOTE — MAU Provider Note (Signed)
History     CSN: [id number]  Arrival date and time: 01/08/16 1518   None     Chief Complaint  Patient presents with  . Emesis   G2P1001 @[redacted]w[redacted]d  by LMP here with N/V x1.5 days. She is unable to tolerate food and fluids. No fevers. She also reports diarrhea x2 episodes today. She reports boyfriend was also sick with similar sx today. She denies VB and abdominal pain.   OB History    Gravida Para Term Preterm AB Living   2 1 1     1    SAB TAB Ectopic Multiple Live Births                  History reviewed. No pertinent past medical history.  Past Surgical History:  Procedure Laterality Date  . CESAREAN SECTION      No family history on file.  Social History  Substance Use Topics  . Smoking status: Former Games developermoker  . Smokeless tobacco: Never Used  . Alcohol use Yes    Allergies: No Known Allergies  No prescriptions prior to admission.    Review of Systems  Constitutional: Negative.   Gastrointestinal: Positive for diarrhea, nausea and vomiting.  Genitourinary: Negative.    Physical Exam   Blood pressure 135/79, pulse 112, temperature 98.1 F (36.7 C), temperature source Oral, resp. rate 16, weight 68.5 kg (151 lb), last menstrual period 11/14/2015, unknown if currently breastfeeding.  Physical Exam  Constitutional: She is oriented to person, place, and time. She appears well-developed and well-nourished. No distress.  HENT:  Head: Normocephalic and atraumatic.  Neck: Normal range of motion.  Cardiovascular: Normal rate.   Respiratory: Effort normal.  GI: Soft. She exhibits no distension and no mass. There is no tenderness. There is no rebound and no guarding.  Musculoskeletal: Normal range of motion.  Neurological: She is alert and oriented to person, place, and time.  Skin: Skin is warm and dry.  Psychiatric: She has a normal mood and affect.   Results for orders placed or performed during the hospital encounter of 01/08/16 (from the past 24 hour(s))   Urinalysis, Routine w reflex microscopic     Status: Abnormal   Collection Time: 01/08/16  3:39 PM  Result Value Ref Range   Color, Urine YELLOW YELLOW   APPearance HAZY (A) CLEAR   Specific Gravity, Urine 1.030 1.005 - 1.030   pH 6.0 5.0 - 8.0   Glucose, UA NEGATIVE NEGATIVE mg/dL   Hgb urine dipstick SMALL (A) NEGATIVE   Bilirubin Urine NEGATIVE NEGATIVE   Ketones, ur NEGATIVE NEGATIVE mg/dL   Protein, ur 30 (A) NEGATIVE mg/dL   Nitrite NEGATIVE NEGATIVE   Leukocytes, UA NEGATIVE NEGATIVE   RBC / HPF 0-5 0 - 5 RBC/hpf   WBC, UA 0-5 0 - 5 WBC/hpf   Bacteria, UA RARE (A) NONE SEEN   Squamous Epithelial / LPF 0-5 (A) NONE SEEN   Mucous PRESENT    MAU Course  Procedures Zofran 4 mg po x1 Po challenge  MDM Labs ordered and reviewed. Vomited small amt after consuming water. Pt requesting to leave with Rx antiemetic. Discussed gastroenteritis is self limiting, should resolve in 24-48 hrs. No evidence of dehydration. May use OTC Imodium if diarrhea recurs. Stable for discharge home.   Assessment and Plan   1. Gastroenteritis    Discharge home Rest Hydration Follow up with OB provider of choice in 1-2 weeks    Medication List    TAKE these medications  promethazine 12.5 MG tablet Commonly known as:  PHENERGAN Take 1 tablet (12.5 mg total) by mouth every 6 (six) hours as needed for nausea or vomiting.      Donette LarryMelanie Trentyn Boisclair, CNM 01/08/2016, 4:33 PM

## 2016-01-08 NOTE — Discharge Instructions (Signed)
Viral Gastroenteritis, Adult Introduction Viral gastroenteritis is also known as the stomach flu. This condition is caused by certain germs (viruses). These germs can be passed from person to person very easily (are very contagious). This condition can cause sudden watery poop (diarrhea), fever, and throwing up (vomiting). Having watery poop and throwing up can make you feel weak and cause you to get dehydrated. Dehydration can make you tired and thirsty, make you have a dry mouth, and make it so you pee (urinate) less often. Older adults and people with other diseases or a weak defense system (immune system) are at higher risk for dehydration. It is important to replace the fluids that you lose from having watery poop and throwing up. Follow these instructions at home: Follow instructions from your doctor about how to care for yourself at home. Eating and drinking Follow these instructions as told by your doctor:  Take an oral rehydration solution (ORS). This is a drink that is sold at pharmacies and stores.  Drink clear fluids in small amounts as you are able, such as:  Water.  Ice chips.  Diluted fruit juice.  Low-calorie sports drinks.  Eat bland, easy-to-digest foods in small amounts as you are able, such as:  Bananas.  Applesauce.  Rice.  Low-fat (lean) meats.  Toast.  Crackers.  Avoid fluids that have a lot of sugar or caffeine in them.  Avoid alcohol.  Avoid spicy or fatty foods. General instructions  Drink enough fluid to keep your pee (urine) clear or pale yellow.  Wash your hands often. If you cannot use soap and water, use hand sanitizer.  Make sure that all people in your home wash their hands well and often.  Rest at home while you get better.  Take over-the-counter and prescription medicines only as told by your doctor.  Watch your condition for any changes.  Take a warm bath to help with any burning or pain from having watery poop.  Keep all  follow-up visits as told by your doctor. This is important. Contact a doctor if:  You cannot keep fluids down.  Your symptoms get worse.  You have new symptoms.  You feel light-headed or dizzy.  You have muscle cramps. Get help right away if:  You have chest pain.  You feel very weak or you pass out (faint).  You see blood in your throw-up.  Your throw-up looks like coffee grounds.  You have bloody or black poop (stools) or poop that look like tar.  You have a very bad headache, a stiff neck, or both.  You have a rash.  You have very bad pain, cramping, or bloating in your belly (abdomen).  You have trouble breathing.  You are breathing very quickly.  Your heart is beating very quickly.  Your skin feels cold and clammy.  You feel confused.  You have pain when you pee.  You have signs of dehydration, such as:  Dark pee, hardly any pee, or no pee.  Cracked lips.  Dry mouth.  Sunken eyes.  Sleepiness.  Weakness. This information is not intended to replace advice given to you by your health care provider. Make sure you discuss any questions you have with your health care provider. Document Released: 07/08/2007 Document Revised: 08/09/2015 Document Reviewed: 09/25/2014  2017 Elsevier  

## 2016-02-03 NOTE — L&D Delivery Note (Signed)
21 y.o. G2P2001 at 8477w5d admitted for SOL, TOLAC. Mother began pushing at 1700 and developed Triple I at 261523. She was started on amp and gent.  A viable female infant was delivered at 2020 in cephalic, LOA position. Loose nuchal cord, easily reduced after delivery. Right anterior shoulder delivered with ease. 30 sec delayed cord clamping. Infant delivered and was pink and had good tone, but was not breathing on his on, was stimulated, dried, and suctioned. The cord was clamped and he was sent to the warmer. Placenta delivered spontaneously intact, with 3VC. Fundus firm on exam with massage and pitocin. Good hemostasis noted.  Anesthesia: Epidural Laceration: First degree Suture: 3.0 Vicryl Good hemostasis noted. EBL: 200 cc  Mom recovering in LDR. Baby sent to NICU.  Apgars: APGAR (1 MIN): 0   APGAR (5 MINS): 2   APGAR (10 MINS): 6   Weight: Pending skin to skin  Sponge and instrument count were correct x2. Placenta sent to pathology. Boy, breast feeding, Lyletta for contraception.  Joanne Simran Bomkamp, DO FM Resident PGY-1 08/18/2016 8:57 PM

## 2016-02-04 ENCOUNTER — Ambulatory Visit: Payer: BLUE CROSS/BLUE SHIELD | Admitting: Obstetrics

## 2016-03-11 ENCOUNTER — Ambulatory Visit: Payer: Medicaid Other | Admitting: Obstetrics

## 2016-03-17 ENCOUNTER — Encounter: Payer: Self-pay | Admitting: *Deleted

## 2016-03-17 ENCOUNTER — Other Ambulatory Visit (HOSPITAL_COMMUNITY)
Admission: RE | Admit: 2016-03-17 | Discharge: 2016-03-17 | Disposition: A | Discharge status: Home / Self Care | Payer: Medicaid Other | Source: Ambulatory Visit | Attending: Certified Nurse Midwife | Admitting: Certified Nurse Midwife

## 2016-03-17 ENCOUNTER — Encounter: Payer: Self-pay | Admitting: Certified Nurse Midwife

## 2016-03-17 ENCOUNTER — Ambulatory Visit (INDEPENDENT_AMBULATORY_CARE_PROVIDER_SITE_OTHER): Payer: Medicaid Other | Admitting: Certified Nurse Midwife

## 2016-03-17 VITALS — BP 108/71 | HR 92

## 2016-03-17 DIAGNOSIS — Z01411 Encounter for gynecological examination (general) (routine) with abnormal findings: Secondary | ICD-10-CM | POA: Diagnosis not present

## 2016-03-17 DIAGNOSIS — Z3493 Encounter for supervision of normal pregnancy, unspecified, third trimester: Secondary | ICD-10-CM

## 2016-03-17 DIAGNOSIS — Z113 Encounter for screening for infections with a predominantly sexual mode of transmission: Secondary | ICD-10-CM | POA: Diagnosis present

## 2016-03-17 DIAGNOSIS — Z98891 History of uterine scar from previous surgery: Secondary | ICD-10-CM

## 2016-03-17 DIAGNOSIS — O34219 Maternal care for unspecified type scar from previous cesarean delivery: Secondary | ICD-10-CM

## 2016-03-17 DIAGNOSIS — Z3490 Encounter for supervision of normal pregnancy, unspecified, unspecified trimester: Secondary | ICD-10-CM | POA: Insufficient documentation

## 2016-03-17 MED ORDER — PRENATE PIXIE 10-0.6-0.4-200 MG PO CAPS: 1.0000 | ORAL_CAPSULE | Freq: Every day | ORAL | 12 refills | Status: AC

## 2016-03-17 NOTE — Progress Notes (Signed)
Subjective:    Joanne Espinoza is being seen today for her first obstetrical visit.  This is a planned pregnancy. She is at 3578w5d gestation. Her obstetrical history is significant for previous C-section, LTCS at term at [facility]. Relationship with FOB: spouse, living together. Patient does intend to breast feed. Pregnancy history fully reviewed.  The information documented in the HPI was reviewed and verified.  Menstrual History: OB History    Gravida Para Term Preterm AB Living   2 1 1     1    SAB TAB Ectopic Multiple Live Births           1    Term delivery via PLTCS   Patient's last menstrual period was 11/14/2015.    History reviewed. No pertinent past medical history.  Past Surgical History:  Procedure Laterality Date  . CESAREAN SECTION       (Not in a hospital admission) No Known Allergies  Social History  Substance Use Topics  . Smoking status: Former Games developermoker  . Smokeless tobacco: Never Used  . Alcohol use Yes    History reviewed. No pertinent family history.   Review of Systems Constitutional: negative for weight loss Gastrointestinal: negative for vomiting Genitourinary:negative for genital lesions and vaginal discharge and dysuria Musculoskeletal:negative for back pain Behavioral/Psych: negative for abusive relationship, depression, illegal drug usage and tobacco use    Objective:    BP 108/71   Pulse 92   LMP 11/14/2015  General Appearance:    Alert, cooperative, no distress, appears stated age  Head:    Normocephalic, without obvious abnormality, atraumatic  Eyes:    PERRL, conjunctiva/corneas clear, EOM's intact, fundi    benign, both eyes  Ears:    Normal TM's and external ear canals, both ears  Nose:   Nares normal, septum midline, mucosa normal, no drainage    or sinus tenderness  Throat:   Lips, mucosa, and tongue normal; teeth and gums normal  Neck:   Supple, symmetrical, trachea midline, no adenopathy;    thyroid:  no  enlargement/tenderness/nodules; no carotid   bruit or JVD  Back:     Symmetric, no curvature, ROM normal, no CVA tenderness  Lungs:     Clear to auscultation bilaterally, respirations unlabored  Chest Wall:    No tenderness or deformity   Heart:    Regular rate and rhythm, S1 and S2 normal, no murmur, rub   or gallop  Breast Exam:    No tenderness, masses, or nipple abnormality  Abdomen:     Soft, non-tender, bowel sounds active all four quadrants,    no masses, no organomegaly  Genitalia:    Normal female without lesion, discharge or tenderness  Extremities:   Extremities normal, atraumatic, no cyanosis or edema  Pulses:   2+ and symmetric all extremities  Skin:   Skin color, texture, turgor normal, no rashes or lesions  Lymph nodes:   Cervical, supraclavicular, and axillary nodes normal  Neurologic:   CNII-XII intact, normal strength, sensation and reflexes    throughout        Cervix: long, thick, closed and posterior.  FH: at slightly less than U.  FHR: 151 by doppler.     Lab Review Urine pregnancy test Labs reviewed yes Radiologic studies reviewed no Assessment:    Pregnancy at 1578w5d weeks   H/O C-section  Plan:     TOLAC consent done. Records release obtained for previous C-section.   Prenatal vitamins.  Counseling provided regarding continued use of  seat belts, cessation of alcohol consumption, smoking or use of illicit drugs; infection precautions i.e., influenza/TDAP immunizations, toxoplasmosis,CMV, parvovirus, listeria and varicella; workplace safety, exercise during pregnancy; routine dental care, safe medications, sexual activity, hot tubs, saunas, pools, travel, caffeine use, fish and methlymercury, potential toxins, hair treatments, varicose veins Weight gain recommendations per IOM guidelines reviewed: underweight/BMI< 18.5--> gain 28 - 40 lbs; normal weight/BMI 18.5 - 24.9--> gain 25 - 35 lbs; overweight/BMI 25 - 29.9--> gain 15 - 25 lbs; obese/BMI >30->gain  11 -  20 lbs Problem list reviewed and updated. FIRST/CF mutation testing/NIPT/QUAD SCREEN/fragile X/Ashkenazi Jewish population testing/Spinal muscular atrophy discussed: ordered. Role of ultrasound in pregnancy discussed; fetal survey: ordered. Amniocentesis discussed: not indicated. VBAC calculator score:50.7% VBAC consent form provided Meds ordered this encounter  Medications  . Prenatal Vit-Fe Fumarate-FA (MULTIVITAMIN-PRENATAL) 27-0.8 MG TABS tablet    Sig: Take 1 tablet by mouth daily at 12 noon.  . Prenat-FeAsp-Meth-FA-DHA w/o A (PRENATE PIXIE) 10-0.6-0.4-200 MG CAPS    Sig: Take 1 tablet by mouth daily.    Dispense:  30 capsule    Refill:  12    Please process coupon: Rx BIN: [id number], RxPCN: [id number], RxGRP: [id number], RxID: [id number]  SUF: 01   Orders Placed This Encounter  Procedures  . Culture, OB Urine  . [place] MFM OB COMP + 14 WK    Standing Status:   Future    Standing Expiration Date:   05/15/2017    Order Specific Question:   Reason for Exam (SYMPTOM  OR DIAGNOSIS REQUIRED)    Answer:   fetal anatomy scan, hx of c-section X1    Order Specific Question:   Preferred imaging location?    Answer:   [facility]  . Hemoglobinopathy evaluation  . Varicella zoster antibody, IgG  . VITAMIN D 25 Hydroxy (Vit-D Deficiency, Fractures)  . MaterniT21 PLUS Core+SCA    Order Specific Question:   Is the patient insulin dependent?    Answer:   No    Order Specific Question:   Please enter gestational age. This should be expressed as weeks AND days, i.e. 16w 6d. Enter weeks here. Enter days in next question.    Answer:   26    Order Specific Question:   Please enter gestational age. This should be expressed as weeks AND days, i.e. 16w 6d. Enter days here. Enter weeks in previous question.    Answer:   5    Order Specific Question:   How was gestational age calculated?    Answer:   LMP    Order Specific Question:   Please give the date of LMP OR Ultrasound OR Estimated date of delivery.     Answer:   08/20/2016    Order Specific Question:   Number of Fetuses (Type of Pregnancy):    Answer:   1    Order Specific Question:   Indications for performing the test? (please choose all that apply):    Answer:   Routine screening    Order Specific Question:   Other Indications? (Y=Yes, N=No)    Answer:   N    Order Specific Question:   If this is a repeat specimen, please indicate the reason:    Answer:   Not indicated    Order Specific Question:   Please specify the patient's race: (C=White/Caucasion, B=Black, I=Native American, A=Asian, H=Hispanic, O=Other, U=Unknown)    Answer:   H    Order Specific Question:   Donor Egg - indicate if the egg was obtained  from in vitro fertilization.    Answer:   N    Order Specific Question:   Age of Egg Donor.    Answer:   70    Order Specific Question:   Prior Down Syndrome/ONTD screening during current pregnancy.    Answer:   N    Order Specific Question:   Prior First Trimester Testing    Answer:   N    Order Specific Question:   Prior Second Trimester Testing    Answer:   N    Order Specific Question:   Family History of Neural Tube Defects    Answer:   N    Order Specific Question:   Prior Pregnancy with Down Syndrome    Answer:   N    Order Specific Question:   Please give the patient's weight (in pounds)    Answer:   150  . Cystic Fibrosis Mutation 97  . ToxASSURE Select 13 (MW), Urine  . Hemoglobin A1c  . AFP, Serum, Open Spina Bifida    Order Specific Question:   Is patient insulin dependent?    Answer:   No    Order Specific Question:   Patient weight (lb.)    Answer:   150    Order Specific Question:   Gestational Age (GA), weeks    Answer:   57.5    Order Specific Question:   Date on which patient was at this GA    Answer:   03/17/2016    Order Specific Question:   GA Calculation Method    Answer:   LMP    Order Specific Question:   GA Date    Answer:   08/20/2016    Order Specific Question:   Number of fetuses     Answer:   1    Order Specific Question:   Donor egg?    Answer:   N    Order Specific Question:   Age of egg donor?    Answer:   21  . Obstetric Panel, Including HIV    Follow up in 4 weeks. 50% of 30 min visit spent on counseling and coordination of care.

## 2016-03-18 LAB — OBSTETRIC PANEL, INCLUDING HIV
Antibody Screen: NEGATIVE
BASOS ABS: 0 10*3/uL (ref 0.0–0.2)
Basos: 0 %
EOS (ABSOLUTE): 0.3 10*3/uL (ref 0.0–0.4)
Eos: 3 %
HEP B S AG: NEGATIVE
HIV SCREEN 4TH GENERATION: NONREACTIVE
Hematocrit: 36.7 % (ref 34.0–46.6)
Hemoglobin: 12.4 g/dL (ref 11.1–15.9)
IMMATURE GRANS (ABS): 0 10*3/uL (ref 0.0–0.1)
IMMATURE GRANULOCYTES: 0 %
LYMPHS: 26 %
Lymphocytes Absolute: 2.1 10*3/uL (ref 0.7–3.1)
MCH: 32 pg (ref 26.6–33.0)
MCHC: 33.8 g/dL (ref 31.5–35.7)
MCV: 95 fL (ref 79–97)
MONOCYTES: 6 %
Monocytes Absolute: 0.5 10*3/uL (ref 0.1–0.9)
NEUTROS ABS: 5.3 10*3/uL (ref 1.4–7.0)
Neutrophils: 65 %
Platelets: 361 10*3/uL (ref 150–379)
RBC: 3.88 x10E6/uL (ref 3.77–5.28)
RDW: 13 % (ref 12.3–15.4)
RPR: NONREACTIVE
RUBELLA: 2.06 {index} (ref >0.99)
Rh Factor: POSITIVE
WBC: 8.2 10*3/uL (ref 3.4–10.8)

## 2016-03-18 LAB — CERVICOVAGINAL ANCILLARY ONLY
Bacterial vaginitis: NEGATIVE
CANDIDA VAGINITIS: NEGATIVE
Chlamydia: NEGATIVE
NEISSERIA GONORRHEA: NEGATIVE
TRICH (WINDOWPATH): NEGATIVE

## 2016-03-20 LAB — URINE CULTURE, OB REFLEX

## 2016-03-20 LAB — CYTOLOGY - PAP: DIAGNOSIS: NEGATIVE

## 2016-03-20 LAB — CULTURE, OB URINE

## 2016-03-22 LAB — TOXASSURE SELECT 13 (MW), URINE

## 2016-03-23 ENCOUNTER — Other Ambulatory Visit: Payer: Self-pay | Admitting: Certified Nurse Midwife

## 2016-03-23 ENCOUNTER — Ambulatory Visit (HOSPITAL_COMMUNITY)
Admission: RE | Admit: 2016-03-23 | Discharge: 2016-03-23 | Disposition: A | Discharge status: Home / Self Care | Payer: Medicaid Other | Source: Ambulatory Visit | Attending: Certified Nurse Midwife | Admitting: Certified Nurse Midwife

## 2016-03-23 DIAGNOSIS — Z3A18 18 weeks gestation of pregnancy: Secondary | ICD-10-CM | POA: Insufficient documentation

## 2016-03-23 DIAGNOSIS — Z3689 Encounter for other specified antenatal screening: Secondary | ICD-10-CM | POA: Insufficient documentation

## 2016-03-23 DIAGNOSIS — O34211 Maternal care for low transverse scar from previous cesarean delivery: Secondary | ICD-10-CM | POA: Diagnosis not present

## 2016-03-23 DIAGNOSIS — Z3490 Encounter for supervision of normal pregnancy, unspecified, unspecified trimester: Secondary | ICD-10-CM

## 2016-03-23 LAB — MATERNIT21 PLUS CORE+SCA
Chromosome 13: NEGATIVE
Chromosome 18: NEGATIVE
Chromosome 21: NEGATIVE
Y Chromosome: DETECTED

## 2016-03-24 ENCOUNTER — Other Ambulatory Visit: Payer: Self-pay | Admitting: Certified Nurse Midwife

## 2016-03-24 DIAGNOSIS — Z3480 Encounter for supervision of other normal pregnancy, unspecified trimester: Secondary | ICD-10-CM

## 2016-03-25 LAB — HEMOGLOBINOPATHY EVALUATION
HGB C: 0 %
HGB S: 0 %
HGB VARIANT: 0 %
Hemoglobin A2 Quantitation: 3.2 % (ref 1.8–3.2)
Hemoglobin F Quantitation: 0 % (ref 0.0–2.0)
Hgb A: 96.8 % (ref 96.4–98.8)

## 2016-03-25 LAB — HEMOGLOBIN A1C
ESTIMATED AVERAGE GLUCOSE: 91
HEMOGLOBIN A1C: 4.8 % (ref 4.8–5.6)

## 2016-03-25 LAB — AFP, SERUM, OPEN SPINA BIFIDA
AFP MoM: 0.93
AFP Value: 38.4 ng/mL
Gest. Age on Collection Date: 17.7 weeks
MATERNAL AGE AT EDD: 22.4 a
OSBR Risk 1 IN: 10000
Test Results:: NEGATIVE
WEIGHT: 150 [lb_av]

## 2016-03-25 LAB — VITAMIN D 25 HYDROXY (VIT D DEFICIENCY, FRACTURES): Vit D, 25-Hydroxy: 20.4 ng/mL — ABNORMAL LOW (ref 30.0–100.0)

## 2016-03-25 LAB — CYSTIC FIBROSIS MUTATION 97: GENE DIS ANAL CARRIER INTERP BLD/T-IMP: NOT DETECTED

## 2016-03-25 LAB — VARICELLA ZOSTER ANTIBODY, IGG: Varicella zoster IgG: 352 index (ref >165)

## 2016-03-26 ENCOUNTER — Other Ambulatory Visit: Payer: Self-pay | Admitting: Certified Nurse Midwife

## 2016-03-26 DIAGNOSIS — R7989 Other specified abnormal findings of blood chemistry: Secondary | ICD-10-CM | POA: Insufficient documentation

## 2016-03-26 DIAGNOSIS — Z3480 Encounter for supervision of other normal pregnancy, unspecified trimester: Secondary | ICD-10-CM

## 2016-03-26 MED ORDER — VITAMIN D (ERGOCALCIFEROL) 1.25 MG (50000 UNIT) PO CAPS: 50000.0000 [IU] | ORAL_CAPSULE | ORAL | 2 refills | Status: DC

## 2016-03-27 ENCOUNTER — Other Ambulatory Visit: Payer: Self-pay | Admitting: Certified Nurse Midwife

## 2016-03-27 DIAGNOSIS — Z3480 Encounter for supervision of other normal pregnancy, unspecified trimester: Secondary | ICD-10-CM

## 2016-03-30 ENCOUNTER — Encounter: Payer: Self-pay | Admitting: *Deleted

## 2016-04-14 ENCOUNTER — Ambulatory Visit (INDEPENDENT_AMBULATORY_CARE_PROVIDER_SITE_OTHER): Payer: Medicaid Other | Admitting: Certified Nurse Midwife

## 2016-04-14 VITALS — BP 115/68 | HR 82 | Temp 99.2°F | Wt 151.2 lb

## 2016-04-14 DIAGNOSIS — Z3482 Encounter for supervision of other normal pregnancy, second trimester: Secondary | ICD-10-CM

## 2016-04-14 DIAGNOSIS — Z98891 History of uterine scar from previous surgery: Secondary | ICD-10-CM

## 2016-04-14 DIAGNOSIS — R7989 Other specified abnormal findings of blood chemistry: Secondary | ICD-10-CM

## 2016-04-14 DIAGNOSIS — O34219 Maternal care for unspecified type scar from previous cesarean delivery: Secondary | ICD-10-CM

## 2016-04-14 DIAGNOSIS — Z3480 Encounter for supervision of other normal pregnancy, unspecified trimester: Secondary | ICD-10-CM

## 2016-04-14 DIAGNOSIS — E559 Vitamin D deficiency, unspecified: Secondary | ICD-10-CM

## 2016-04-14 NOTE — Progress Notes (Signed)
   PRENATAL VISIT NOTE  Subjective:  Joanne Espinoza is a 21 y.o. G2P1001 at 2045w5d being seen today for ongoing prenatal care.  She is currently monitored for the following issues for this low-risk pregnancy and has Supervision of normal pregnancy, antepartum; H/O cesarean section; and Low vitamin D level on her problem list.  Patient reports no complaints.  Contractions: Not present. Vag. Bleeding: None.  Movement: Present. Denies leaking of fluid.   The following portions of the patient's history were reviewed and updated as appropriate: allergies, current medications, past family history, past medical history, past social history, past surgical history and problem list. Problem list updated.  Objective:   Vitals:   04/14/16 1508  BP: 115/68  Pulse: 82  Temp: 99.2 F (37.3 C)  Weight: 151 lb 3.2 oz (68.6 kg)    Fetal Status: Fetal Heart Rate (bpm): 140 Fundal Height: 21 cm Movement: Present     General:  Alert, oriented and cooperative. Patient is in no acute distress.  Skin: Skin is warm and dry. No rash noted.   Cardiovascular: Normal heart rate noted  Respiratory: Normal respiratory effort, no problems with respiration noted  Abdomen: Soft, gravid, appropriate for gestational age. Pain/Pressure: Absent     Pelvic:  Cervical exam deferred        Extremities: Normal range of motion.  Edema: None  Mental Status: Normal mood and affect. Normal behavior. Normal judgment and thought content.   Assessment and Plan:  Pregnancy: G2P1001 at 3845w5d  1. Supervision of other normal pregnancy, antepartum     Contraception choices discussed  2. H/O cesarean section     TOLAC planned, consent in media file  3. Low vitamin D level     Taking weekly vitamin D.   Preterm labor symptoms and general obstetric precautions including but not limited to vaginal bleeding, contractions, leaking of fluid and fetal movement were reviewed in detail with the patient. Please refer to After Visit  Summary for other counseling recommendations.  Return in about 4 weeks (around 05/12/2016) for ROB.   Roe Coombsachelle A Denney, CNM

## 2016-04-14 NOTE — Progress Notes (Signed)
Patient reports good fetal movement. 

## 2016-04-14 NOTE — Patient Instructions (Signed)
AREA PEDIATRIC/FAMILY PRACTICE PHYSICIANS  [facility] [address] [place], [place]  [address] Phone - [phone]   Fax - [phone]  [facility] [address] [place], [place] [address] Phone - [phone]   Fax - [phone]  [facility] [address] [place], [place]  [address] Phone - [phone]   Fax - [phone]  [facility] [address] [place], [place]  [address] Phone - [phone]   Fax - [phone]  [facility] [address] [place], [place]  [address] Phone - [phone]   Fax - [phone]  [facility] [address] [place], [place]  [address] Phone - [phone]   Fax - [phone]  [facility] [address] [place], [place]  [address] Phone - [phone]   Fax - [phone]  [facility] AT [facility] [address] [place] [place], [place]  [address] Phone - [phone]   Fax - [phone]  [facility] AT [facility] [address] [place], [place]  [address] Phone - [phone]   Fax - [phone] [facility] AT [place] [address] [place], [place]  [address] Phone - [phone]   Fax - [phone]  [facility] [address] [place], [place]  [address] Phone - [phone]   Fax - [phone]  [facility] [address] [place], [place]  [address] Phone - [phone]   Fax - [phone]  [facility] [address] [place], [place]  [address] Phone - [phone]   Fax - [phone]  [facility] [address] [place], [place]  [address] Phone - [phone]  [facility] [address] [place], [place]  [address] Phone - [phone]   Fax - [phone]  [facility] [address] [place], [place]  [address] Phone - [phone]   Fax - [phone]  [facility] [address] [place], [place]  [address] Phone - [phone]   Fax - [phone]  [facility] [address] [place], [place]  [address] Phone - [phone]   Fax - [phone]   [facility] [address] [place] [address] [place], [place]  [address] Phone - [phone]   Fax - [phone]  [facility] [address] [place], [place]  [address] Phone - [phone]   Fax - [phone]  [facility] [address] [place], [place]  [address] Phone - [phone]   Fax - [phone]  [facility] [address] [place], [place]  [address] Phone - [phone]   Fax - [phone]  [facility] [address] [place], [place]  [address] Phone - [phone]   Fax - [phone] Ger[facility] Fraction [address] [place], [place]  [address] Phone - [phone]   Fax - [phone]  [facility] [address] [place], [place]  [address] Phone - [phone]   Fax - [phone]  [facility] [place] [address] [place], [place]  [address] Phone - [phone]   Fax - [phone]     Places to have your son circumcised:    [facility] [phone] $480 by 4 wks  [facility] [phone] $244 by 4 wks  [facility] [phone] $175 by 2 wks  [facility] [phone] $220 by 4 wks [facility] [phone] $150 by 4 wks  These prices sometimes change but are roughly what you can expect to pay. Please call and confirm pricing.   Circumcision is considered an elective/non-medically necessary procedure. There are many reasons parents decide to have  their sons circumsized. During the first year of life circumcised males have a reduced risk of urinary tract infections but after this year the rates between circumcised males and uncircumcised males are the same.  It is safe to have your son circumcised outside of the hospital and the places above perform them regularly.

## 2016-05-05 ENCOUNTER — Ambulatory Visit (HOSPITAL_COMMUNITY)
Admission: RE | Admit: 2016-05-05 | Discharge: 2016-05-05 | Disposition: A | Discharge status: Home / Self Care | Payer: Medicaid Other | Source: Ambulatory Visit | Attending: Certified Nurse Midwife | Admitting: Certified Nurse Midwife

## 2016-05-05 DIAGNOSIS — Z3A24 24 weeks gestation of pregnancy: Secondary | ICD-10-CM | POA: Diagnosis not present

## 2016-05-05 DIAGNOSIS — Z362 Encounter for other antenatal screening follow-up: Secondary | ICD-10-CM | POA: Insufficient documentation

## 2016-05-05 DIAGNOSIS — O34219 Maternal care for unspecified type scar from previous cesarean delivery: Secondary | ICD-10-CM | POA: Insufficient documentation

## 2016-05-05 DIAGNOSIS — Z3480 Encounter for supervision of other normal pregnancy, unspecified trimester: Secondary | ICD-10-CM

## 2016-05-07 ENCOUNTER — Other Ambulatory Visit: Payer: Self-pay | Admitting: Certified Nurse Midwife

## 2016-05-07 DIAGNOSIS — Z3480 Encounter for supervision of other normal pregnancy, unspecified trimester: Secondary | ICD-10-CM

## 2016-05-13 ENCOUNTER — Encounter: Payer: Self-pay | Admitting: Certified Nurse Midwife

## 2016-05-13 ENCOUNTER — Ambulatory Visit (INDEPENDENT_AMBULATORY_CARE_PROVIDER_SITE_OTHER): Payer: Medicaid Other | Admitting: Certified Nurse Midwife

## 2016-05-13 VITALS — BP 128/67 | HR 97 | Wt 152.4 lb

## 2016-05-13 DIAGNOSIS — R7989 Other specified abnormal findings of blood chemistry: Secondary | ICD-10-CM

## 2016-05-13 DIAGNOSIS — O34219 Maternal care for unspecified type scar from previous cesarean delivery: Secondary | ICD-10-CM

## 2016-05-13 DIAGNOSIS — Z3482 Encounter for supervision of other normal pregnancy, second trimester: Secondary | ICD-10-CM

## 2016-05-13 DIAGNOSIS — E559 Vitamin D deficiency, unspecified: Secondary | ICD-10-CM

## 2016-05-13 DIAGNOSIS — Z3480 Encounter for supervision of other normal pregnancy, unspecified trimester: Secondary | ICD-10-CM

## 2016-05-13 DIAGNOSIS — Z98891 History of uterine scar from previous surgery: Secondary | ICD-10-CM

## 2016-05-13 NOTE — Progress Notes (Signed)
   PRENATAL VISIT NOTE  Subjective:  Joanne Espinoza is a 21 y.o. G2P1001 at [redacted]w[redacted]d being seen today for ongoing prenatal care.  She is currently monitored for the following issues for this low-risk pregnancy and has Supervision of normal pregnancy, antepartum; H/O cesarean section; and Low vitamin D level on her problem list.  Patient reports no complaints.  Contractions: Not present. Vag. Bleeding: None.  Movement: Present. Denies leaking of fluid.   The following portions of the patient's history were reviewed and updated as appropriate: allergies, current medications, past family history, past medical history, past social history, past surgical history and problem list. Problem list updated.  Objective:   Vitals:   05/13/16 1517  BP: 128/67  Pulse: 97  Weight: 152 lb 6.4 oz (69.1 kg)    Fetal Status:     Movement: Present     General:  Alert, oriented and cooperative. Patient is in no acute distress.  Skin: Skin is warm and dry. No rash noted.   Cardiovascular: Normal heart rate noted  Respiratory: Normal respiratory effort, no problems with respiration noted  Abdomen: Soft, gravid, appropriate for gestational age. Pain/Pressure: Absent     Pelvic:  Cervical exam deferred        Extremities: Normal range of motion.  Edema: None  Mental Status: Normal mood and affect. Normal behavior. Normal judgment and thought content.   Assessment and Plan:  Pregnancy: G2P1001 at [redacted]w[redacted]d   1. Supervision of other normal pregnancy, antepartum Patient reports is doing well.   2. H/O cesarean section TOLAC planned,consent in file  3. Low vitamin D level Instructed to pick up VitD at pharmacy has refills and continue to take weekly.  Preterm labor symptoms and general obstetric precautions including but not limited to vaginal bleeding, contractions, leaking of fluid and fetal movement were reviewed in detail with the patient. Please refer to After Visit Summary for other counseling  recommendations.  Return in about 2 weeks (around 05/27/2016) for ROB, 2 hr OGTT.   Elinor Parkinson, Student-MidWife

## 2016-05-13 NOTE — Progress Notes (Signed)
   PRENATAL VISIT NOTE  SubjectBrayli Espinoza is a 21 y.o. G2P1001 at [redacted]w[redacted]d being seen today for ongoing prenatal care.  She is currently monitored for the following issues for this low-risk pregnancy and has Supervision of normal pregnancy, antepartum; H/O cesarean section; and Low vitamin D level on her problem list.  Patient reports no complaints.  Contractions: Not present. Vag. Bleeding: None.  Movement: Present. Denies leaking of fluid.   The following portions of the patient's history were reviewed and updated as appropriate: allergies, current medications, past family history, past medical history, past social history, past surgical history and problem list. Problem list updated.  Objective:   Vitals:   05/13/16 1517  BP: 128/67  Pulse: 97  Weight: 152 lb 6.4 oz (69.1 kg)    Fetal Status: Fetal Heart Rate (bpm): 141 Fundal Height: 25 cm Movement: Present     General:  Alert, oriented and cooperative. Patient is in no acute distress.  Skin: Skin is warm and dry. No rash noted.   Cardiovascular: Normal heart rate noted  Respiratory: Normal respiratory effort, no problems with respiration noted  Abdomen: Soft, gravid, appropriate for gestational age. Pain/Pressure: Absent     Pelvic:  Cervical exam deferred        Extremities: Normal range of motion.  Edema: None  Mental Status: Normal mood and affect. Normal behavior. Normal judgment and thought content.   Assessment and Plan:  Pregnancy: G2P1001 at [redacted]w[redacted]d  1. Supervision of other normal pregnancy, antepartum     Doing well  2. H/O cesarean section      TOLAC planned  3. Low vitamin D level     Taking weekly vitamin D.   Preterm labor symptoms and general obstetric precautions including but not limited to vaginal bleeding, contractions, leaking of fluid and fetal movement were reviewed in detail with the patient. Please refer to After Visit Summary for other counseling recommendations.  Return in about 2  weeks (around 05/27/2016) for ROB, 2 hr OGTT.   Roe Coombs, CNM

## 2016-05-13 NOTE — Progress Notes (Signed)
Patient reports no concerns today 

## 2016-05-27 ENCOUNTER — Encounter: Payer: Self-pay | Admitting: Certified Nurse Midwife

## 2016-05-27 ENCOUNTER — Ambulatory Visit (INDEPENDENT_AMBULATORY_CARE_PROVIDER_SITE_OTHER): Payer: Medicaid Other | Admitting: Certified Nurse Midwife

## 2016-05-27 ENCOUNTER — Other Ambulatory Visit: Payer: Medicaid Other

## 2016-05-27 VITALS — BP 120/71 | HR 88 | Wt 155.0 lb

## 2016-05-27 DIAGNOSIS — Z23 Encounter for immunization: Secondary | ICD-10-CM | POA: Diagnosis not present

## 2016-05-27 DIAGNOSIS — Z3490 Encounter for supervision of normal pregnancy, unspecified, unspecified trimester: Secondary | ICD-10-CM

## 2016-05-27 DIAGNOSIS — Z98891 History of uterine scar from previous surgery: Secondary | ICD-10-CM

## 2016-05-27 DIAGNOSIS — Z3482 Encounter for supervision of other normal pregnancy, second trimester: Secondary | ICD-10-CM

## 2016-05-27 DIAGNOSIS — R7989 Other specified abnormal findings of blood chemistry: Secondary | ICD-10-CM

## 2016-05-27 DIAGNOSIS — E559 Vitamin D deficiency, unspecified: Secondary | ICD-10-CM

## 2016-05-27 DIAGNOSIS — O34219 Maternal care for unspecified type scar from previous cesarean delivery: Secondary | ICD-10-CM

## 2016-05-27 NOTE — Progress Notes (Signed)
   PRENATAL VISIT NOTE  Subjective:  Joanne Espinoza is a 21 y.o. G2P1001 at [redacted]w[redacted]d being seen today for ongoing prenatal care.  She is currently monitored for the following issues for this low-risk pregnancy and has Supervision of normal pregnancy, antepartum; H/O cesarean section; and Low vitamin D level on her problem list.  Patient reports no complaints.  Contractions: Not present. Vag. Bleeding: None.  Movement: Present. Denies leaking of fluid.   The following portions of the patient's history were reviewed and updated as appropriate: allergies, current medications, past family history, past medical history, past social history, past surgical history and problem list. Problem list updated.  Objective:   Vitals:   05/27/16 0832  BP: 120/71  Pulse: 88  Weight: 155 lb (70.3 kg)    Fetal Status: Fetal Heart Rate (bpm): 135 Fundal Height: 29 cm Movement: Present     General:  Alert, oriented and cooperative. Patient is in no acute distress.  Skin: Skin is warm and dry. No rash noted.   Cardiovascular: Normal heart rate noted  Respiratory: Normal respiratory effort, no problems with respiration noted  Abdomen: Soft, gravid, appropriate for gestational age. Pain/Pressure: Absent     Pelvic:  Cervical exam deferred        Extremities: Normal range of motion.  Edema: None  Mental Status: Normal mood and affect. Normal behavior. Normal judgment and thought content.   Assessment and Plan:  Pregnancy: G2P1001 at [redacted]w[redacted]d  1. Encounter for supervision of normal pregnancy, antepartum, unspecified gravidity     Doing well - Glucose Tolerance, 2 Hours w/1 Hour - CBC - HIV antibody (with reflex) - RPR - Tdap vaccine greater than or equal to 7yo IM  2. H/O cesarean section     TOLAC planned  3. Low vitamin D level     Taking weekly vitamin D  Preterm labor symptoms and general obstetric precautions including but not limited to vaginal bleeding, contractions, leaking of fluid and  fetal movement were reviewed in detail with the patient. Please refer to After Visit Summary for other counseling recommendations.  Return in about 2 weeks (around 06/10/2016) for ROB.   Roe Coombs, CNM

## 2016-05-28 ENCOUNTER — Other Ambulatory Visit: Payer: Self-pay | Admitting: Certified Nurse Midwife

## 2016-05-28 DIAGNOSIS — Z3480 Encounter for supervision of other normal pregnancy, unspecified trimester: Secondary | ICD-10-CM

## 2016-05-28 LAB — HIV ANTIBODY (ROUTINE TESTING W REFLEX): HIV SCREEN 4TH GENERATION: NONREACTIVE

## 2016-05-28 LAB — CBC
HEMATOCRIT: 36.6 % (ref 34.0–46.6)
Hemoglobin: 11.8 g/dL (ref 11.1–15.9)
MCH: 30.6 pg (ref 26.6–33.0)
MCHC: 32.2 g/dL (ref 31.5–35.7)
MCV: 95 fL (ref 79–97)
Platelets: 320 10*3/uL (ref 150–379)
RBC: 3.85 x10E6/uL (ref 3.77–5.28)
RDW: 13 % (ref 12.3–15.4)
WBC: 7.3 10*3/uL (ref 3.4–10.8)

## 2016-05-28 LAB — GLUCOSE TOLERANCE, 2 HOURS W/ 1HR
GLUCOSE, 1 HOUR: 124 mg/dL (ref 65–179)
GLUCOSE, 2 HOUR: 93 mg/dL (ref 65–152)
Glucose, Fasting: 64 mg/dL — ABNORMAL LOW (ref 65–91)

## 2016-05-28 LAB — RPR: RPR: NONREACTIVE

## 2016-06-10 ENCOUNTER — Encounter: Payer: Self-pay | Admitting: Certified Nurse Midwife

## 2016-06-10 ENCOUNTER — Ambulatory Visit (INDEPENDENT_AMBULATORY_CARE_PROVIDER_SITE_OTHER): Payer: Medicaid Other | Admitting: Certified Nurse Midwife

## 2016-06-10 VITALS — BP 109/67 | HR 87 | Wt 153.4 lb

## 2016-06-10 DIAGNOSIS — O34219 Maternal care for unspecified type scar from previous cesarean delivery: Secondary | ICD-10-CM

## 2016-06-10 DIAGNOSIS — R7989 Other specified abnormal findings of blood chemistry: Secondary | ICD-10-CM

## 2016-06-10 DIAGNOSIS — Z3480 Encounter for supervision of other normal pregnancy, unspecified trimester: Secondary | ICD-10-CM

## 2016-06-10 DIAGNOSIS — E559 Vitamin D deficiency, unspecified: Secondary | ICD-10-CM

## 2016-06-10 DIAGNOSIS — Z98891 History of uterine scar from previous surgery: Secondary | ICD-10-CM

## 2016-06-10 DIAGNOSIS — Z3483 Encounter for supervision of other normal pregnancy, third trimester: Secondary | ICD-10-CM

## 2016-06-10 NOTE — Progress Notes (Signed)
   PRENATAL VISIT NOTE  Subjective:  Joanne Espinoza is a 21 y.o. G2P1001 at 963w6d being seen today for ongoing prenatal care.  She is currently monitored for the following issues for this low-risk pregnancy and has Supervision of normal pregnancy, antepartum; H/O cesarean section; and Low vitamin D level on her problem list.  Patient reports no complaints.  Contractions: Not present. Vag. Bleeding: None.  Movement: Present. Denies leaking of fluid.   The following portions of the patient's history were reviewed and updated as appropriate: allergies, current medications, past family history, past medical history, past social history, past surgical history and problem list. Problem list updated.  Objective:   Vitals:   06/10/16 1554  BP: 109/67  Pulse: 87  Weight: 153 lb 6.4 oz (69.6 kg)    Fetal Status: Fetal Heart Rate (bpm): 138 Fundal Height: 30 cm Movement: Present     General:  Alert, oriented and cooperative. Patient is in no acute distress.  Skin: Skin is warm and dry. No rash noted.   Cardiovascular: Normal heart rate noted  Respiratory: Normal respiratory effort, no problems with respiration noted  Abdomen: Soft, gravid, appropriate for gestational age. Pain/Pressure: Present     Pelvic:  Cervical exam deferred        Extremities: Normal range of motion.  Edema: None  Mental Status: Normal mood and affect. Normal behavior. Normal judgment and thought content.   Assessment and Plan:  Pregnancy: G2P1001 at 3363w6d  1. Supervision of other normal pregnancy, antepartum Patient reports doing well.  2. H/O cesarean section TOLAC,consent signed in chart.  3. Low vitamin D level Continue taking Vitamin D weekly Preterm labor symptoms and general obstetric precautions including but not limited to vaginal bleeding, contractions, leaking of fluid and fetal movement were reviewed in detail with the patient. Please refer to After Visit Summary for other counseling  recommendations.  Return in about 2 weeks (around 06/24/2016) for ROB.   Roe Coombsenney, Rachelle A, CNM

## 2016-06-10 NOTE — Progress Notes (Signed)
Patient reports pressure, denies contractions, and reports good fetal movement.

## 2016-06-25 ENCOUNTER — Ambulatory Visit (INDEPENDENT_AMBULATORY_CARE_PROVIDER_SITE_OTHER): Payer: Medicaid Other | Admitting: Certified Nurse Midwife

## 2016-06-25 DIAGNOSIS — E559 Vitamin D deficiency, unspecified: Secondary | ICD-10-CM

## 2016-06-25 DIAGNOSIS — Z3483 Encounter for supervision of other normal pregnancy, third trimester: Secondary | ICD-10-CM

## 2016-06-25 DIAGNOSIS — Z3480 Encounter for supervision of other normal pregnancy, unspecified trimester: Secondary | ICD-10-CM

## 2016-06-25 DIAGNOSIS — R7989 Other specified abnormal findings of blood chemistry: Secondary | ICD-10-CM

## 2016-06-25 DIAGNOSIS — Z98891 History of uterine scar from previous surgery: Secondary | ICD-10-CM

## 2016-06-25 MED ORDER — VITAMIN D (ERGOCALCIFEROL) 1.25 MG (50000 UNIT) PO CAPS: 50000.0000 [IU] | ORAL_CAPSULE | ORAL | 2 refills | Status: AC

## 2016-06-25 NOTE — Progress Notes (Signed)
   PRENATAL VISIT NOTE  Subjective:  Joanne AbideMarissa Kilbourne is a 21 y.o. G2P1001 at 8074w0d being seen today for ongoing prenatal care.  She is currently monitored for the following issues for this low-risk pregnancy and has Supervision of normal pregnancy, antepartum; H/O cesarean section; and Low vitamin D level on her problem list.  Patient reports no complaints.  Contractions: Not present. Vag. Bleeding: None.  Movement: Present. Denies leaking of fluid.   The following portions of the patient's history were reviewed and updated as appropriate: allergies, current medications, past family history, past medical history, past social history, past surgical history and problem list. Problem list updated.  Objective:   Vitals:   06/25/16 1509  BP: 132/79  Pulse: (!) 112  Weight: 155 lb (70.3 kg)    Fetal Status: Fetal Heart Rate (bpm): 141 Fundal Height: 32 cm Movement: Present   FHT 141,Fundal Height-32cm  General:  Alert, oriented and cooperative. Patient is in no acute distress.  Skin: Skin is warm and dry. No rash noted.   Cardiovascular: Normal heart rate noted  Respiratory: Normal respiratory effort, no problems with respiration noted  Abdomen: Soft, gravid, appropriate for gestational age. Pain/Pressure: Absent     Pelvic:  Cervical exam deferred        Extremities: Normal range of motion.     Mental Status: Normal mood and affect. Normal behavior. Normal judgment and thought content.   Assessment and Plan:  Pregnancy: G2P1001 at 4674w0d  1. Supervision of other normal pregnancy, antepartum Patient reports doing well.   2. H/O cesarean section TOLAC, consent signed in chart.   3. Low vitamin D level Encouraged to take weekly Vit D.  Preterm labor symptoms and general obstetric precautions including but not limited to vaginal bleeding, contractions, leaking of fluid and fetal movement were reviewed in detail with the patient. Please refer to After Visit Summary for other  counseling recommendations.  2 wk f/up ROB.  Roe Coombsachelle A Denney, CNM

## 2016-07-09 ENCOUNTER — Ambulatory Visit (INDEPENDENT_AMBULATORY_CARE_PROVIDER_SITE_OTHER): Payer: Medicaid Other | Admitting: Certified Nurse Midwife

## 2016-07-09 VITALS — BP 131/80 | HR 86 | Wt 156.5 lb

## 2016-07-09 DIAGNOSIS — Z98891 History of uterine scar from previous surgery: Secondary | ICD-10-CM

## 2016-07-09 DIAGNOSIS — Z3480 Encounter for supervision of other normal pregnancy, unspecified trimester: Secondary | ICD-10-CM

## 2016-07-09 DIAGNOSIS — R7989 Other specified abnormal findings of blood chemistry: Secondary | ICD-10-CM

## 2016-07-09 DIAGNOSIS — Z3483 Encounter for supervision of other normal pregnancy, third trimester: Secondary | ICD-10-CM

## 2016-07-09 NOTE — Progress Notes (Signed)
Patient reports good fetal movement, denies pain. 

## 2016-07-10 ENCOUNTER — Encounter: Payer: Self-pay | Admitting: Certified Nurse Midwife

## 2016-07-10 NOTE — Progress Notes (Signed)
   PRENATAL VISIT NOTE  Subjective:  Joanne AbideMarissa Vaeth is a 21 y.o. G2P1001 at 6048w1d being seen today for ongoing prenatal care.  She is currently monitored for the following issues for this low-risk pregnancy and has Supervision of normal pregnancy, antepartum; H/O cesarean section; and Low vitamin D level on her problem list.  Patient reports no complaints.  Contractions: Not present. Vag. Bleeding: None.  Movement: Present. Denies leaking of fluid.   The following portions of the patient's history were reviewed and updated as appropriate: allergies, current medications, past family history, past medical history, past social history, past surgical history and problem list. Problem list updated.  Objective:   Vitals:   07/09/16 1559  BP: 131/80  Pulse: 86  Weight: 156 lb 8 oz (71 kg)    Fetal Status: Fetal Heart Rate (bpm): 134 Fundal Height: 34 cm Movement: Present     General:  Alert, oriented and cooperative. Patient is in no acute distress.  Skin: Skin is warm and dry. No rash noted.   Cardiovascular: Normal heart rate noted  Respiratory: Normal respiratory effort, no problems with respiration noted  Abdomen: Soft, gravid, appropriate for gestational age. Pain/Pressure: Absent     Pelvic:  Cervical exam deferred        Extremities: Normal range of motion.  Edema: None  Mental Status: Normal mood and affect. Normal behavior. Normal judgment and thought content.   Assessment and Plan:  Pregnancy: G2P1001 at 7148w1d  1. Supervision of other normal pregnancy, antepartum     Doing well.    2. H/O cesarean section     TOLAC  3. Low vitamin D level     Taking weekly vitamin D.   Preterm labor symptoms and general obstetric precautions including but not limited to vaginal bleeding, contractions, leaking of fluid and fetal movement were reviewed in detail with the patient. Please refer to After Visit Summary for other counseling recommendations.  Return in about 2 weeks (around  07/23/2016) for ROB, GBS.   Roe Coombsachelle A Renada Cronin, CNM

## 2016-07-23 ENCOUNTER — Ambulatory Visit (INDEPENDENT_AMBULATORY_CARE_PROVIDER_SITE_OTHER): Payer: Medicaid Other | Admitting: Certified Nurse Midwife

## 2016-07-23 ENCOUNTER — Other Ambulatory Visit (HOSPITAL_COMMUNITY)
Admission: RE | Admit: 2016-07-23 | Discharge: 2016-07-23 | Disposition: A | Discharge status: Home / Self Care | Payer: Medicaid Other | Source: Ambulatory Visit | Attending: Certified Nurse Midwife | Admitting: Certified Nurse Midwife

## 2016-07-23 VITALS — BP 134/84 | HR 88 | Wt 158.6 lb

## 2016-07-23 DIAGNOSIS — Z3480 Encounter for supervision of other normal pregnancy, unspecified trimester: Secondary | ICD-10-CM | POA: Insufficient documentation

## 2016-07-23 DIAGNOSIS — Z3483 Encounter for supervision of other normal pregnancy, third trimester: Secondary | ICD-10-CM

## 2016-07-23 DIAGNOSIS — Z98891 History of uterine scar from previous surgery: Secondary | ICD-10-CM

## 2016-07-23 DIAGNOSIS — R7989 Other specified abnormal findings of blood chemistry: Secondary | ICD-10-CM

## 2016-07-23 LAB — OB RESULTS CONSOLE GC/CHLAMYDIA: Gonorrhea: NEGATIVE

## 2016-07-23 NOTE — Progress Notes (Signed)
Patient is getting cramps in her calves.

## 2016-07-23 NOTE — Progress Notes (Signed)
   PRENATAL VISIT NOTE  Subjective:  Joanne Espinoza is a [age] y.o. G2P1001 at 195w0d being seen today for ongoing prenatal care.  She is currently monitored for the following issues for this low-risk pregnancy and has Supervision of normal pregnancy, antepartum; H/O cesarean section; and Low vitamin D level on her problem list.  Patient reports no complaints.  Contractions: Not present. Vag. Bleeding: None.  Movement: Present. Denies leaking of fluid.   The following portions of the patient's history were reviewed and updated as appropriate: allergies, current medications, past family history, past medical history, past social history, past surgical history and problem list. Problem list updated.  Objective:   Vitals:   07/23/16 1555  BP: 134/84  Pulse: 88  Weight: 158 lb 9.6 oz (71.9 kg)    Fetal Status: Fetal Heart Rate (bpm): 136 Fundal Height: 36 cm Movement: Present  Presentation: Vertex  General:  Alert, oriented and cooperative. Patient is in no acute distress.  Skin: Skin is warm and dry. No rash noted.   Cardiovascular: Normal heart rate noted  Respiratory: Normal respiratory effort, no problems with respiration noted  Abdomen: Soft, gravid, appropriate for gestational age. Pain/Pressure: Present     Pelvic:  Cervical exam performed Dilation: Closed Effacement (%): 0 Station: Ballotable  Extremities: Normal range of motion.  Edema: None  Mental Status: Normal mood and affect. Normal behavior. Normal judgment and thought content.   Assessment and Plan:  Pregnancy: G2P1001 at [redacted]w[redacted]d  1. Supervision of other normal pregnancy, antepartum       - Strep Gp B NAA - Cervicovaginal ancillary only  2. H/O cesarean section     TOLAC  3. Low vitamin D level     Taking vitamin D  Preterm labor symptoms and general obstetric precautions including but not limited to vaginal bleeding, contractions, leaking of fluid and fetal movement were reviewed in detail with the  patient. Please refer to After Visit Summary for other counseling recommendations.  Return in about 1 week (around 07/30/2016) for ROB.   Roe Coombsachelle A Loreen Bankson, CNM

## 2016-07-24 LAB — OB RESULTS CONSOLE GBS: GBS: NEGATIVE

## 2016-07-25 LAB — STREP GP B NAA: STREP GROUP B AG: NEGATIVE

## 2016-07-27 LAB — CERVICOVAGINAL ANCILLARY ONLY
Bacterial vaginitis: NEGATIVE
Candida vaginitis: NEGATIVE
Chlamydia: NEGATIVE
Neisseria Gonorrhea: NEGATIVE
Trichomonas: NEGATIVE

## 2016-07-28 ENCOUNTER — Other Ambulatory Visit: Payer: Self-pay | Admitting: Certified Nurse Midwife

## 2016-07-28 DIAGNOSIS — Z3480 Encounter for supervision of other normal pregnancy, unspecified trimester: Secondary | ICD-10-CM

## 2016-07-30 ENCOUNTER — Ambulatory Visit (INDEPENDENT_AMBULATORY_CARE_PROVIDER_SITE_OTHER): Payer: Medicaid Other | Admitting: Certified Nurse Midwife

## 2016-07-30 ENCOUNTER — Encounter: Payer: Self-pay | Admitting: Certified Nurse Midwife

## 2016-07-30 VITALS — BP 126/77 | HR 102 | Wt 159.4 lb

## 2016-07-30 DIAGNOSIS — Z3480 Encounter for supervision of other normal pregnancy, unspecified trimester: Secondary | ICD-10-CM

## 2016-07-30 DIAGNOSIS — Z3483 Encounter for supervision of other normal pregnancy, third trimester: Secondary | ICD-10-CM

## 2016-07-30 DIAGNOSIS — R7989 Other specified abnormal findings of blood chemistry: Secondary | ICD-10-CM

## 2016-07-30 DIAGNOSIS — O34219 Maternal care for unspecified type scar from previous cesarean delivery: Secondary | ICD-10-CM

## 2016-07-30 DIAGNOSIS — Z98891 History of uterine scar from previous surgery: Secondary | ICD-10-CM

## 2016-07-30 NOTE — Progress Notes (Signed)
   PRENATAL VISIT NOTE  Subjective:  Joanne Espinoza is a 21 y.o. G2P1001 at 7162w0d being seen today for ongoing prenatal care.  She is currently monitored for the following issues for this low-risk pregnancy and has Supervision of normal pregnancy, antepartum; H/O cesarean section; and Low vitamin D level on her problem list.  Patient reports no complaints.  Contractions: Irregular. Vag. Bleeding: None.  Movement: Present. Denies leaking of fluid.   The following portions of the patient's history were reviewed and updated as appropriate: allergies, current medications, past family history, past medical history, past social history, past surgical history and problem list. Problem list updated.  Objective:   Vitals:   07/30/16 1625  BP: 126/77  Pulse: (!) 102  Weight: 159 lb 6.4 oz (72.3 kg)    Fetal Status: Fetal Heart Rate (bpm): 140 Fundal Height: 37 cm Movement: Present     General:  Alert, oriented and cooperative. Patient is in no acute distress.  Skin: Skin is warm and dry. No rash noted.   Cardiovascular: Normal heart rate noted  Respiratory: Normal respiratory effort, no problems with respiration noted  Abdomen: Soft, gravid, appropriate for gestational age. Pain/Pressure: Absent     Pelvic:  Cervical exam deferred        Extremities: Normal range of motion.  Edema: None  Mental Status: Normal mood and affect. Normal behavior. Normal judgment and thought content.   Assessment and Plan:  Pregnancy: G2P1001 at 5162w0d  1. Supervision of other normal pregnancy, antepartum     Doing well  2. H/O cesarean section     TOLAC  3. Low vitamin D level      Taking weekly vitamin D.   Term labor symptoms and general obstetric precautions including but not limited to vaginal bleeding, contractions, leaking of fluid and fetal movement were reviewed in detail with the patient. Please refer to After Visit Summary for other counseling recommendations.  Return in about 1 week  (around 08/06/2016) for ROB.   Roe Coombsachelle A Mirayah Wren, CNM

## 2016-08-11 ENCOUNTER — Ambulatory Visit (INDEPENDENT_AMBULATORY_CARE_PROVIDER_SITE_OTHER): Payer: Medicaid Other | Admitting: Certified Nurse Midwife

## 2016-08-11 VITALS — BP 125/79 | HR 108 | Wt 160.0 lb

## 2016-08-11 DIAGNOSIS — Z3483 Encounter for supervision of other normal pregnancy, third trimester: Secondary | ICD-10-CM

## 2016-08-11 DIAGNOSIS — R7989 Other specified abnormal findings of blood chemistry: Secondary | ICD-10-CM

## 2016-08-11 DIAGNOSIS — E559 Vitamin D deficiency, unspecified: Secondary | ICD-10-CM

## 2016-08-11 DIAGNOSIS — Z3480 Encounter for supervision of other normal pregnancy, unspecified trimester: Secondary | ICD-10-CM

## 2016-08-11 DIAGNOSIS — Z98891 History of uterine scar from previous surgery: Secondary | ICD-10-CM

## 2016-08-11 NOTE — Progress Notes (Signed)
   PRENATAL VISIT NOTE  Subjective:  Joanne Espinoza is a 21 y.o. G2P1001 at 4968w5d being seen today for ongoing prenatal care.  She is currently monitored for the following issues for this low-risk pregnancy and has Supervision of normal pregnancy, antepartum; H/O cesarean section; and Low vitamin D level on her problem list.  Patient reports no complaints.  Contractions: Irregular. Vag. Bleeding: None.  Movement: Present. Denies leaking of fluid.   The following portions of the patient's history were reviewed and updated as appropriate: allergies, current medications, past family history, past medical history, past social history, past surgical history and problem list. Problem list updated.  Objective:   Vitals:   08/11/16 1445  BP: 125/79  Pulse: (!) 108  Weight: 160 lb (72.6 kg)    Fetal Status: Fetal Heart Rate (bpm): 147 Fundal Height: 38 cm Movement: Present     General:  Alert, oriented and cooperative. Patient is in no acute distress.  Skin: Skin is warm and dry. No rash noted.   Cardiovascular: Normal heart rate noted  Respiratory: Normal respiratory effort, no problems with respiration noted  Abdomen: Soft, gravid, appropriate for gestational age. Pain/Pressure: Present     Pelvic:  Cervical exam deferred        Extremities: Normal range of motion.     Mental Status: Normal mood and affect. Normal behavior. Normal judgment and thought content.   Assessment and Plan:  Pregnancy: G2P1001 at 2168w5d  1. Supervision of other normal pregnancy, antepartum     Doing well  2. H/O cesarean section     TOLAC  3. Low vitamin D level    Taking vitamin D weekly.  Term labor symptoms and general obstetric precautions including but not limited to vaginal bleeding, contractions, leaking of fluid and fetal movement were reviewed in detail with the patient. Please refer to After Visit Summary for other counseling recommendations.  Return in about 1 week (around 08/18/2016) for  ROB.   Roe Coombsachelle A Kimberlie Csaszar, CNM

## 2016-08-18 ENCOUNTER — Inpatient Hospital Stay (HOSPITAL_COMMUNITY): Payer: Medicaid Other | Admitting: Anesthesiology

## 2016-08-18 ENCOUNTER — Encounter (HOSPITAL_COMMUNITY): Payer: Self-pay | Admitting: *Deleted

## 2016-08-18 ENCOUNTER — Inpatient Hospital Stay (HOSPITAL_COMMUNITY)
Admission: AD | Admit: 2016-08-18 | Discharge: 2016-08-20 | DRG: 775 | Disposition: A | Discharge status: Home / Self Care | Payer: Medicaid Other | Source: Ambulatory Visit | Attending: Family Medicine | Admitting: Family Medicine

## 2016-08-18 DIAGNOSIS — Z87891 Personal history of nicotine dependence: Secondary | ICD-10-CM

## 2016-08-18 DIAGNOSIS — O34211 Maternal care for low transverse scar from previous cesarean delivery: Secondary | ICD-10-CM | POA: Diagnosis present

## 2016-08-18 DIAGNOSIS — Z3A39 39 weeks gestation of pregnancy: Secondary | ICD-10-CM | POA: Diagnosis not present

## 2016-08-18 DIAGNOSIS — O411230 Chorioamnionitis, third trimester, not applicable or unspecified: Secondary | ICD-10-CM | POA: Diagnosis present

## 2016-08-18 DIAGNOSIS — Z3493 Encounter for supervision of normal pregnancy, unspecified, third trimester: Secondary | ICD-10-CM | POA: Diagnosis present

## 2016-08-18 DIAGNOSIS — O411290 Chorioamnionitis, unspecified trimester, not applicable or unspecified: Secondary | ICD-10-CM

## 2016-08-18 DIAGNOSIS — Z3480 Encounter for supervision of other normal pregnancy, unspecified trimester: Secondary | ICD-10-CM

## 2016-08-18 HISTORY — DX: Other specified health status: Z78.9

## 2016-08-18 LAB — CBC
HCT: 35.7 % — ABNORMAL LOW (ref 36.0–46.0)
Hemoglobin: 11.9 g/dL — ABNORMAL LOW (ref 12.0–15.0)
MCH: 28.5 pg (ref 26.0–34.0)
MCHC: 33.3 g/dL (ref 30.0–36.0)
MCV: 85.6 fL (ref 78.0–100.0)
PLATELETS: 319 10*3/uL (ref 150–400)
RBC: 4.17 MIL/uL (ref 3.87–5.11)
RDW: 15.9 % — AB (ref 11.5–15.5)
WBC: 9.4 10*3/uL (ref 4.0–10.5)

## 2016-08-18 LAB — TYPE AND SCREEN
ABO/RH(D): O POS
Antibody Screen: NEGATIVE

## 2016-08-18 MED ORDER — SODIUM CHLORIDE 0.9 % IV SOLN
2.0000 g | Freq: Four times a day (QID) | INTRAVENOUS | Status: DC
  Administered 2016-08-18: 2 g via INTRAVENOUS
  Filled 2016-08-18 (×2): qty 2000

## 2016-08-18 MED ORDER — OXYCODONE-ACETAMINOPHEN 5-325 MG PO TABS: 2.0000 | ORAL_TABLET | ORAL | Status: DC | PRN

## 2016-08-18 MED ORDER — ACETAMINOPHEN 325 MG PO TABS: 650.0000 mg | ORAL_TABLET | ORAL | Status: DC | PRN

## 2016-08-18 MED ORDER — FENTANYL CITRATE (PF) 100 MCG/2ML IJ SOLN: 100.0000 ug | INTRAMUSCULAR | Status: DC | PRN

## 2016-08-18 MED ORDER — DIPHENHYDRAMINE HCL 25 MG PO CAPS: 25.0000 mg | ORAL_CAPSULE | Freq: Four times a day (QID) | ORAL | Status: DC | PRN

## 2016-08-18 MED ORDER — OXYTOCIN 40 UNITS IN LACTATED RINGERS INFUSION - SIMPLE MED
2.5000 [IU]/h | INTRAVENOUS | Status: DC
  Filled 2016-08-18: qty 1000

## 2016-08-18 MED ORDER — PHENYLEPHRINE 40 MCG/ML (10ML) SYRINGE FOR IV PUSH (FOR BLOOD PRESSURE SUPPORT)
80.0000 ug | PREFILLED_SYRINGE | INTRAVENOUS | Status: DC | PRN
  Filled 2016-08-18: qty 5

## 2016-08-18 MED ORDER — OXYCODONE-ACETAMINOPHEN 5-325 MG PO TABS: 1.0000 | ORAL_TABLET | ORAL | Status: DC | PRN

## 2016-08-18 MED ORDER — LACTATED RINGERS IV SOLN
500.0000 mL | INTRAVENOUS | Status: DC | PRN
  Administered 2016-08-18: 1000 mL via INTRAVENOUS

## 2016-08-18 MED ORDER — LACTATED RINGERS IV SOLN: 500.0000 mL | Freq: Once | INTRAVENOUS | Status: DC

## 2016-08-18 MED ORDER — DIBUCAINE 1 % RE OINT: 1.0000 "application " | TOPICAL_OINTMENT | RECTAL | Status: DC | PRN

## 2016-08-18 MED ORDER — LIDOCAINE HCL (PF) 1 % IJ SOLN
INTRAMUSCULAR | Status: DC | PRN
  Administered 2016-08-18: 8 mL via EPIDURAL
  Administered 2016-08-18: 6 mL via EPIDURAL

## 2016-08-18 MED ORDER — PRENATAL MULTIVITAMIN CH
1.0000 | ORAL_TABLET | Freq: Every day | ORAL | Status: DC
  Administered 2016-08-19 – 2016-08-20 (×2): 1 via ORAL
  Filled 2016-08-18 (×2): qty 1

## 2016-08-18 MED ORDER — SENNOSIDES-DOCUSATE SODIUM 8.6-50 MG PO TABS
2.0000 | ORAL_TABLET | ORAL | Status: DC
  Administered 2016-08-19 (×2): 2 via ORAL
  Filled 2016-08-18 (×2): qty 2

## 2016-08-18 MED ORDER — ZOLPIDEM TARTRATE 5 MG PO TABS
5.0000 mg | ORAL_TABLET | Freq: Every evening | ORAL | Status: DC | PRN
Start: 2016-08-18 — End: 2016-08-20

## 2016-08-18 MED ORDER — COCONUT OIL OIL
1.0000 "application " | TOPICAL_OIL | Status: DC | PRN
  Filled 2016-08-18: qty 120

## 2016-08-18 MED ORDER — ONDANSETRON HCL 4 MG PO TABS
4.0000 mg | ORAL_TABLET | ORAL | Status: DC | PRN
Start: 2016-08-18 — End: 2016-08-20

## 2016-08-18 MED ORDER — SOD CITRATE-CITRIC ACID 500-334 MG/5ML PO SOLN: 30.0000 mL | ORAL | Status: DC | PRN

## 2016-08-18 MED ORDER — ONDANSETRON HCL 4 MG/2ML IJ SOLN: 4.0000 mg | INTRAMUSCULAR | Status: DC | PRN

## 2016-08-18 MED ORDER — EPHEDRINE 5 MG/ML INJ
10.0000 mg | INTRAVENOUS | Status: DC | PRN
  Filled 2016-08-18: qty 2

## 2016-08-18 MED ORDER — WITCH HAZEL-GLYCERIN EX PADS: 1.0000 "application " | MEDICATED_PAD | CUTANEOUS | Status: DC | PRN

## 2016-08-18 MED ORDER — PHENYLEPHRINE 40 MCG/ML (10ML) SYRINGE FOR IV PUSH (FOR BLOOD PRESSURE SUPPORT)
80.0000 ug | PREFILLED_SYRINGE | INTRAVENOUS | Status: DC | PRN
  Filled 2016-08-18: qty 10
  Filled 2016-08-18: qty 5

## 2016-08-18 MED ORDER — ACETAMINOPHEN 325 MG PO TABS
650.0000 mg | ORAL_TABLET | ORAL | Status: DC | PRN
  Administered 2016-08-18: 650 mg via ORAL
  Filled 2016-08-18: qty 2

## 2016-08-18 MED ORDER — ONDANSETRON HCL 4 MG/2ML IJ SOLN: 4.0000 mg | Freq: Four times a day (QID) | INTRAMUSCULAR | Status: DC | PRN

## 2016-08-18 MED ORDER — GENTAMICIN SULFATE 40 MG/ML IJ SOLN
5.0000 mg/kg | INTRAVENOUS | Status: DC
  Administered 2016-08-18: 360 mg via INTRAVENOUS
  Filled 2016-08-18: qty 9

## 2016-08-18 MED ORDER — SIMETHICONE 80 MG PO CHEW: 80.0000 mg | CHEWABLE_TABLET | ORAL | Status: DC | PRN

## 2016-08-18 MED ORDER — TETANUS-DIPHTH-ACELL PERTUSSIS 5-2.5-18.5 LF-MCG/0.5 IM SUSP: 0.5000 mL | Freq: Once | INTRAMUSCULAR | Status: DC

## 2016-08-18 MED ORDER — BENZOCAINE-MENTHOL 20-0.5 % EX AERO
1.0000 "application " | INHALATION_SPRAY | CUTANEOUS | Status: DC | PRN
  Filled 2016-08-18: qty 56

## 2016-08-18 MED ORDER — LIDOCAINE HCL (PF) 1 % IJ SOLN
30.0000 mL | INTRAMUSCULAR | Status: DC | PRN
  Filled 2016-08-18: qty 30

## 2016-08-18 MED ORDER — OXYTOCIN BOLUS FROM INFUSION: 500.0000 mL | Freq: Once | INTRAVENOUS | Status: DC

## 2016-08-18 MED ORDER — LACTATED RINGERS IV SOLN
INTRAVENOUS | Status: DC
  Administered 2016-08-18 (×4): via INTRAVENOUS

## 2016-08-18 MED ORDER — IBUPROFEN 600 MG PO TABS
600.0000 mg | ORAL_TABLET | Freq: Four times a day (QID) | ORAL | Status: DC
  Administered 2016-08-18 – 2016-08-20 (×5): 600 mg via ORAL
  Filled 2016-08-18 (×6): qty 1

## 2016-08-18 MED ORDER — DIPHENHYDRAMINE HCL 50 MG/ML IJ SOLN: 12.5000 mg | INTRAMUSCULAR | Status: DC | PRN

## 2016-08-18 MED ORDER — FENTANYL 2.5 MCG/ML BUPIVACAINE 1/10 % EPIDURAL INFUSION (WH - ANES)
14.0000 mL/h | INTRAMUSCULAR | Status: DC | PRN
  Administered 2016-08-18 (×3): 14 mL/h via EPIDURAL
  Filled 2016-08-18 (×3): qty 100

## 2016-08-18 NOTE — Progress Notes (Signed)
Here for sol. Tolac. Called 8 cm last check, 7 on my check. Cat 2 strip (intermittent shallow variables). arom clear. Expectant mgmt.

## 2016-08-18 NOTE — MAU Note (Signed)
PT SAYS UC SINCE 0130. NO VE IN OFFICE  GBS-   UNSURE

## 2016-08-18 NOTE — Progress Notes (Signed)
Pt now with fever x2 30 minutes apart, baseline 155-160, otherwise good variability without decels. Has pushed for 1 hour and is at 1+ station. Maternal effort is not very good. On exam vertex appears to be OT; I ttempted to turn but unsuccessful. Treating for triple I with amp/gent. Continue pushing. Mindful of prolonged 2nd stage given TOLAC.

## 2016-08-18 NOTE — Anesthesia Pain Management Evaluation Note (Signed)
  CRNA Pain Management Visit Note  Patient: Joanne Espinoza, 21 y.o., female  "Hello I am a [facility] the [facility]. We have an anesthesia team available at all times to provide care throughout the hospital, including epidural management and anesthesia for C-section. I don't know your plan for the delivery whether it a natural birth, water birth, IV sedation, nitrous supplementation, doula or epidural, but we want to meet your pain goals."   1.Was your pain managed to your expectations on prior hospitalizations?   Yes   2.What is your expectation for pain management during this hospitalization?     Epidural  3.How can we help you reach that goal? Epidural in place and working well.    Record the patient's initial score and the patient's pain goal.   Pain: 0 Just some pressure.  Pain Goal: 4 [facility] to be able to say your pain was always managed very well.  Joanne Espinoza 08/18/2016

## 2016-08-18 NOTE — Progress Notes (Signed)
LABOR PROGRESS NOTE  Joanne Espinoza is a 21 y.o. G2P1001 at 7978w5d  admitted for SOL, TOLAC  Subjective: Currently Joanne Espinoza is having contractions and is fully dilated. She reports not feeling the pressure from the contractions. She reports feeling pain over the right posterior hip, but not in the abdomen. She is also reporting a little bit of nausea and it is hindering her ability to push with the contractions.   Objective: BP (!) 144/81   Pulse (!) 111   Temp 97.8 F (36.6 C) (Oral) Comment: awakened for temperature  Resp 18   Ht 5\' 4"  (1.626 m)   Wt 72.6 kg (160 lb)   LMP 11/14/2015   SpO2 96%   BMI 27.46 kg/m  or  Vitals:   08/18/16 1531 08/18/16 1601 08/18/16 1631 08/18/16 1646  BP: 125/68 128/90 (!) 145/94 (!) 144/81  Pulse: 91 (!) 114 97 (!) 111  Resp: 16 20 18    Temp:      TempSrc:      SpO2:      Weight:      Height:         Dilation: 10 Dilation Complete Date: 08/18/16 Dilation Complete Time: 1611 Effacement (%): 100 Cervical Position: Anterior Station: +1, +2 Presentation: Vertex Exam by:: Enis SlipperJane Bailey, RN  Labs: Lab Results  Component Value Date   WBC 9.4 08/18/2016   HGB 11.9 (L) 08/18/2016   HCT 35.7 (L) 08/18/2016   MCV 85.6 08/18/2016   PLT 319 08/18/2016    Patient Active Problem List   Diagnosis Date Noted  . Labor and delivery indication for care or intervention 08/18/2016  . Low vitamin D level 03/26/2016  . Supervision of normal pregnancy, antepartum 03/17/2016  . H/O cesarean section 03/17/2016    Assessment / Plan: 21 y.o. G2P1001 at 2478w5d here for SOL, TOLAC  Labor: progressing     Fetal Wellbeing:  HR 150 Pain Control:  Epidural  Anticipated MOD: vbac Nausea: pt has prescription for ondansetron(zofran) q6hr/PRN  Sullivan LoneBrannon L Idaliz Tinkle, medical student  08/18/2016, 4:58 PM

## 2016-08-18 NOTE — Anesthesia Procedure Notes (Signed)
Epidural Patient location during procedure: OB Start time: 08/18/2016 5:05 AM End time: 08/18/2016 5:08 AM  Staffing Anesthesiologist: Leilani AbleHATCHETT, Desare Duddy Performed: anesthesiologist   Preanesthetic Checklist Completed: patient identified, surgical consent, pre-op evaluation, timeout performed, IV checked, risks and benefits discussed and monitors and equipment checked  Epidural Patient position: sitting Prep: site prepped and draped and DuraPrep Patient monitoring: continuous pulse ox and blood pressure Approach: midline Location: L3-L4 Injection technique: LOR air  Needle:  Needle type: Tuohy  Needle gauge: 17 G Needle length: 9 cm and 9 Needle insertion depth: 5 cm cm Catheter type: closed end flexible Catheter size: 19 Gauge Catheter at skin depth: 10 cm Test dose: negative and Other  Assessment Sensory level: T9 Events: blood not aspirated, injection not painful, no injection resistance, negative IV test and no paresthesia  Additional Notes Reason for block:procedure for pain

## 2016-08-18 NOTE — H&P (Signed)
Joanne Espinoza is a 21 y.o. female G2P1001 @ 39.5wks presenting for labor eval. Denies leaking or bldg. No H/A, N/V or visual disturbances. Her preg has been followed by the [facility] service and has been remarkable for 1) prev C/S for FTP vs FHR 2) GBS neg  OB History    Gravida Para Term Preterm AB Living   2 1 1     1    SAB TAB Ectopic Multiple Live Births           1     History reviewed. No pertinent past medical history. Past Surgical History:  Procedure Laterality Date  . CESAREAN SECTION     Family History: family history is not on file. Social History:  reports that she has quit smoking. She has never used smokeless tobacco. She reports that she drinks alcohol. She reports that she does not use drugs.     Maternal Diabetes: No Genetic Screening: Normal Maternal Ultrasounds/Referrals: Normal Fetal Ultrasounds or other Referrals:  None Maternal Substance Abuse:  No Significant Maternal Medications:  None Significant Maternal Lab Results:  Lab values include: Group B Strep negative Other Comments:  None  ROS History Dilation: 5 Effacement (%): 90 Station: -2 Exam by:: benji stanley rn  Height 5\' 2"  (1.575 m), weight 73.7 kg (162 lb 8 oz), last menstrual period 11/14/2015, unknown if currently breastfeeding. Exam Physical Exam  Constitutional: She is oriented to person, place, and time. She appears well-developed.  HENT:  Head: Normocephalic.  Neck: Normal range of motion.  Cardiovascular: Normal rate.   Respiratory: Effort normal.  GI:  EFM 130s, +accels, occ mi variables Ctx q 2 mins  Musculoskeletal: Normal range of motion.  Neurological: She is alert and oriented to person, place, and time.  Skin: Skin is warm and dry.  Psychiatric: She has a normal mood and affect. Her behavior is normal. Thought content normal.    Prenatal labs: ABO, Rh: O/Positive/-- (02/13 1711) Antibody: Negative (02/13 1711) Rubella: 2.06 (02/13 1711) RPR: Non Reactive (04/25  1045)  HBsAg: Negative (02/13 1711)  HIV: Non Reactive (04/25 1045)  GBS: Negative (06/21 1633)   Assessment/Plan: IUP@39 .5wks TOLAC Early active labor GBS neg  [facility] Expectant management Plans epidural for labor Anticipate SVD   Cam HaiSHAW, KIMBERLY CNM 08/18/2016, 4:23 AM

## 2016-08-18 NOTE — Anesthesia Preprocedure Evaluation (Signed)
Anesthesia Evaluation  Patient identified by MRN, date of birth, ID band Patient awake    Reviewed: Allergy & Precautions, H&P , NPO status , Patient's Chart, lab work & pertinent test results  Airway Mallampati: I  TM Distance: >3 FB Neck ROM: full    Dental no notable dental hx. (+) Teeth Intact   Pulmonary neg pulmonary ROS, former smoker,    Pulmonary exam normal breath sounds clear to auscultation       Cardiovascular negative cardio ROS Normal cardiovascular exam Rhythm:regular Rate:Normal     Neuro/Psych negative neurological ROS  negative psych ROS   GI/Hepatic negative GI ROS, Neg liver ROS,   Endo/Other  negative endocrine ROS  Renal/GU negative Renal ROS  negative genitourinary   Musculoskeletal negative musculoskeletal ROS (+)   Abdominal Normal abdominal exam  (+)   Peds  Hematology negative hematology ROS (+)   Anesthesia Other Findings   Reproductive/Obstetrics (+) Pregnancy                             Anesthesia Physical Anesthesia Plan  ASA: II  Anesthesia Plan: Epidural   Post-op Pain Management:    Induction:   PONV Risk Score and Plan:   Airway Management Planned:   Additional Equipment:   Intra-op Plan:   Post-operative Plan:   Informed Consent: I have reviewed the patients History and Physical, chart, labs and discussed the procedure including the risks, benefits and alternatives for the proposed anesthesia with the patient or authorized representative who has indicated his/her understanding and acceptance.     Plan Discussed with:   Anesthesia Plan Comments:         Anesthesia Quick Evaluation

## 2016-08-18 NOTE — Progress Notes (Signed)
Went to room to eval pt as protracted active phase in tolac patient. On exam category1 fetal heart tracing, 9/90/-1. IUPC placed. Is making slow but steady change. Will use iupc to measure contraction adequacy, should labor stall would consider trial of pitocin (though not extended trial given tolac). Expectant mgmt for now.

## 2016-08-19 ENCOUNTER — Encounter (HOSPITAL_COMMUNITY): Payer: Self-pay | Admitting: *Deleted

## 2016-08-19 LAB — RPR: RPR: NONREACTIVE

## 2016-08-19 NOTE — Progress Notes (Signed)
Post Partum Day 1 Subjective:  Joanne Espinoza is a 21 y.o. G2P2001 2106w5d s/p SVD with Triple I.  No acute events overnight.  Pt denies problems with ambulating, voiding or po intake.  She denies nausea or vomiting.  Pain is well controlled.  She has had flatus.  Lochia Small.  Plan for birth control is IUD.  Method of Feeding: breast/pumping  Objective: Blood pressure (!) 99/42, pulse 80, temperature 98.1 F (36.7 C), temperature source Oral, resp. rate 18, height 5\' 4"  (1.626 m), weight 160 lb (72.6 kg), last menstrual period 11/14/2015, SpO2 100 %, unknown if currently breastfeeding.  Physical Exam:  General: alert, cooperative and no distress Lochia:normal flow Chest: normal WOB Heart: Regular rate Abdomen: +BS, soft, mild TTP (appropriate) Uterine Fundus: firm DVT Evaluation: No evidence of DVT seen on physical exam. Extremities: no edema   Recent Labs  08/18/16 0425  HGB 11.9*  HCT 35.7*    Assessment/Plan:  ASSESSMENT: Joanne Espinoza is a 21 y.o. 202P2001 50106w5d s/p NSVD with Triple I s/p amp/gent  Plan for discharge tomorrow and Breastfeeding Infant in NICU Continue routine PP care Breastfeeding support PRN  LOS: 1 day   Federico FlakeKimberly Niles Kaylinn Dedic 08/19/2016, 4:02 PM

## 2016-08-19 NOTE — Lactation Note (Signed)
This note was copied from a baby's chart. Lactation Consultation Note  Patient Name: Joanne Espinoza AbideMarissa Vandermeulen NWGNF'AToday's Date: 08/19/2016 Reason for consult: Initial assessment  NICU baby 4021 hours old. Mom reports that she is pumping and getting EBM, and she is taking EBM to baby in NICU. Enc mom to continue pumping every 2-3 hours followed by hand expression. [facility] and [facility]. Mom had been pumping one breast at a time, so enc mom to pump both breast simultaneously. [facility] given [facility] brochure and NICU booklet (copied) with review, and mom aware of pumping rooms in NICU. Mom requested a manual pump, and Stephanie, NT asked to take to mom. Mom states that she use DEBP with good results before and would like to have in addition to DEBP.   Maternal Data Has patient been taught Hand Expression?: Yes Does the patient have breastfeeding experience prior to this delivery?: Yes  Feeding    LATCH Score/Interventions                      Lactation Tools Discussed/Used Tools: Pump Breast pump type: Double-Electric Breast Pump WIC Program: Yes Pump Review: Setup, frequency, and cleaning;Milk Storage Initiated by:: bedside RN. Date initiated:: 08/18/16   Consult Status Consult Status: Follow-up Date: 08/20/16 Follow-up type: In-patient    Sherlyn HayJennifer D Zaahir Pickney 08/19/2016, 5:30 PM

## 2016-08-19 NOTE — Anesthesia Postprocedure Evaluation (Signed)
Anesthesia Post Note  Patient: Joanne Espinoza  Procedure(s) Performed: * No procedures listed *     Patient location during evaluation: Mother Baby Anesthesia Type: Epidural Level of consciousness: awake Pain management: satisfactory to patient Vital Signs Assessment: post-procedure vital signs reviewed and stable Respiratory status: spontaneous breathing Cardiovascular status: stable Anesthetic complications: no    Last Vitals:  Vitals:   08/18/16 2325 08/19/16 0400  BP: 116/65 (!) 101/51  Pulse: 80 75  Resp: 18 18  Temp: 37 C 36.4 C    Last Pain:  Vitals:   08/19/16 0400  TempSrc: Oral  PainSc:    Pain Goal: Patients Stated Pain Goal: 2 (08/18/16 2325)               Cephus ShellingBURGER,Calea Hribar

## 2016-08-19 NOTE — Progress Notes (Signed)
I offered support as Joanne Espinoza shared her birth story.  She said that she had difficulty sleeping last night due to anxious thoughts about her baby, but today she is doing much better. She has spoken to her sister who is in the medical profession and feels more confident.  Her parents were here visiting as well as helping with their older child.  She was grateful for the support.    Chaplain Dyanne CarrelKaty Cannan Beeck, Bcc Pager, [phone] 3:14 PM    08/19/16 1500  Clinical Encounter Type  Visited With Patient;Family  Visit Type Spiritual support

## 2016-08-20 ENCOUNTER — Ambulatory Visit: Payer: Self-pay

## 2016-08-20 ENCOUNTER — Encounter: Payer: Medicaid Other | Admitting: Certified Nurse Midwife

## 2016-08-20 DIAGNOSIS — O411290 Chorioamnionitis, unspecified trimester, not applicable or unspecified: Secondary | ICD-10-CM

## 2016-08-20 MED ORDER — IBUPROFEN 600 MG PO TABS: 600.0000 mg | ORAL_TABLET | Freq: Four times a day (QID) | ORAL | 1 refills | Status: AC | PRN

## 2016-08-20 NOTE — Discharge Instructions (Signed)

## 2016-08-20 NOTE — Progress Notes (Signed)
Discharge teaching complete with pt. Pt understood all information and did not have any questions. WIC appointment scheduled for today at 1 pm. Pt ambulated out of the hospital and discharged home to family.

## 2016-08-20 NOTE — Lactation Note (Signed)
This note was copied from a baby's chart. Lactation Consultation Note  Patient Name: Joanne Espinoza QMVHQ'IToday's Date: 08/20/2016 Reason for consult: Follow-up assessment;NICU baby  NICU baby 6638 hours old. [facility] but no one is returning her call. [facility] and enc to call when she is ready for pump. Mom aware of OP/BFSG and LC phone line assistance after D/C.   Maternal Data    Feeding    LATCH Score/Interventions                      Lactation Tools Discussed/Used Tools: Pump Breast pump type: Double-Electric Breast Pump   Consult Status Consult Status: PRN    Sherlyn HayJennifer D Ommie Degeorge 08/20/2016, 11:13 AM

## 2016-08-20 NOTE — Discharge Summary (Signed)
OB Discharge Summary     Patient Name: Joanne Espinoza DOB: 1995-03-14 MRN: [id number]  Date of admission: 08/18/2016 Delivering MD: SHIRLEY, [place]   Date of discharge: 08/20/2016  Admitting diagnosis: 39.4 WEEKS CTX Intrauterine pregnancy: [redacted]w[redacted]d     Secondary diagnosis:  Active Problems:   Labor and delivery indication for care or intervention   SVD (spontaneous vaginal delivery)   Chorioamnionitis, delivered, current hospitalization  Additional problems: contraception planning  IUD at 4 wk     Discharge diagnosis: Term Pregnancy Delivered and VBAC                                                                                                Post partum procedures:  Augmentation: Pitocin  Complications: Intrauterine Inflammation or infection (Chorioamniotis)  Hospital course:  Onset of Labor With Vaginal Delivery     21 y.o. yo G2P2001 at [redacted]w[redacted]d was admitted in Latent Labor on 08/18/2016. At 5 cm dilation Patient had an complicated labor course as follows:  Membrane Rupture Time/Date: 12:05 PM ,08/18/2016   Intrapartum Procedures: Episiotomy: None [1]                                         Lacerations:  1st degree [2]    Attestation signed by Cheral Marker, CNM at 08/18/2016 11:10 PM  Birth supervised by me.  [redacted]w[redacted]d admitted for desired TOLAC w/ spontaneous labor, augmented w/ arom approximately 8 hours prior to delivery, clear fluid. GBS neg. Began pushing at 1700, developed Triple I shortly thereafter- treated with amp and gent. Pushed for approximately 3 hours 20 minutes total w/ good effort and progress.  After we were called to room to attend birth, pt pushed approximately 2 contractions, and delivered head in LOA position, no difficulty w/ shoulders, loose nuchal x 1 reduced immediately after birth. Infant placed directly on mom's abdomen, noted to have good tone, skin color, however no respiratory effort despite stimulation and bulb suctioning, so at app 30sec of age  cord clamped x 2 and cut and infant taken to radiant warmer for resuscitation, code apgar called-please refer to their notes for further documentation on resuscitation.  Arterial and venous blood gases obtained, unfortunately they were unable to be ran d/t clotting.  Review of EFM strip reveals Cat 2, baseline 155, mod variability, +accels, mild variables w/ pushing. 1st degree perineal lac repaired by Dr. Talbert Forest and myself Placenta to path d/t Triple I Cheral Marker, CNM, [facility] 08/18/2016 11:09 PM        [] Hide copied text [] Hover for attribution information  21 y.o. G2P2001 at [redacted]w[redacted]d admitted for SOL, TOLAC. Mother began pushing at 1700 and developed Triple I at 22. She was started on amp and gent.  A viable female infant was delivered at 2020 in cephalic, LOA position. Loose nuchal cord, easily reduced after delivery. Right anterior shoulder delivered with ease. 30 sec delayed cord clamping. Infant delivered and was pink and had good tone, but was not breathing on his  on, was stimulated, dried, and suctioned. The cord was clamped and he was sent to the warmer. Placenta delivered spontaneously intact, with 3VC. Fundus firm on exam with massage and pitocin. Good hemostasis noted.  Anesthesia: Epidural Laceration: First degree Suture: 3.0 Vicryl Good hemostasis noted. EBL: 200 cc  Mom recovering in LDR. Baby sent to NICU.  Apgars: APGAR (1 MIN): 0   APGAR (5 MINS): 2   APGAR (10 MINS): 6   Weight: Pending skin to skin  Sponge and instrument count were correct x2. Placenta sent to pathology. Boy, breast feeding, Lyletta for contraception.  [place] Shirley, DO FM Resident PGY-1 08/18/2016 8:57 PM        Patient had a delivery of a Viable infant. 08/18/2016  Information for the patient's newborn:  Arlana LindauBarrientos, Boy [name] [[id number]][[id number]]  Delivery Method: VBAC, Spontaneous (Filed from Delivery Summary)  the baby grew out MRSA, and required intubation, and was in NICU at  the time of maternal d/c, improving  [name] had an uncomplicated postpartum course.  She is ambulating, tolerating a regular diet, passing flatus, and urinating well. Patient is discharged home in stable condition on 08/20/16.   Physical exam  Vitals:   08/19/16 1200 08/19/16 1530 08/19/16 2102 08/20/16 0820  BP: 112/68 (!) 99/42 114/68 105/62  Pulse: 77 80 90 92  Resp: 18 18 18 18   Temp: (!) 97.4 F (36.3 C) 98.1 F (36.7 C) 97.9 F (36.6 C) 98.3 F (36.8 C)  TempSrc: Oral Oral Oral Oral  SpO2: 100% 100% 100% 100%  Weight:      Height:       General: alert, cooperative and no distress Lochia: appropriate Uterine Fundus: firm Incision:  DVT Evaluation: No evidence of DVT seen on physical exam. Labs: Lab Results  Component Value Date   WBC 9.4 08/18/2016   HGB 11.9 (L) 08/18/2016   HCT 35.7 (L) 08/18/2016   MCV 85.6 08/18/2016   PLT 319 08/18/2016   CMP Latest Ref Rng & Units 04/06/2013  Glucose 70 - 99 mg/dL 71  BUN 6 - 23 mg/dL 16  Creatinine 9.560.50 - 2.131.10 mg/dL 0.860.62  Sodium 578137 - 469147 mEq/L 143  Potassium 3.7 - 5.3 mEq/L 3.7  Chloride 96 - 112 mEq/L 105  CO2 19 - 32 mEq/L 25  Calcium 8.4 - 10.5 mg/dL 9.7    Discharge instruction: per After Visit Summary and "Baby and Me Booklet".  After visit meds:  Allergies as of 08/20/2016      Reactions   Azithromycin Rash      Medication List    TAKE these medications   ibuprofen 600 MG tablet Commonly known as:  ADVIL,MOTRIN Take 1 tablet (600 mg total) by mouth every 6 (six) hours as needed.   PRENATE PIXIE 10-0.6-0.4-200 MG Caps Take 1 tablet by mouth daily.   Vitamin D (Ergocalciferol) 50000 units Caps capsule Commonly known as:  DRISDOL Take 1 capsule (50,000 Units total) by mouth every 7 (seven) days.       Diet: routine diet  Activity: Advance as tolerated. Pelvic rest for 6 weeks.   Outpatient follow up:6 weeks Follow up Appt:No future appointments. Follow up Visit:No Follow-up on  file.  Postpartum contraception: IUD Mirena  Newborn Data: Live born female  Birth Weight: 6 lb 11.9 oz (3060 g) APGAR: 0, 2  Baby Feeding: Bottle and Breast Disposition:NICU   08/20/2016 Tilda BurrowFERGUSON,Brycelynn Stampley V, MD

## 2016-08-20 NOTE — Lactation Note (Addendum)
This note was copied from a baby's chart. Lactation Consultation Note  Patient Name: Joanne Espinoza [id number] Date: 08/20/2016 Reason for consult: Follow-up assessment;NICU baby  Mom able to get an appointment with [facility] today, 08/20/16 at 1300. Enc mom to take pumping kit, and discussed bringing kit back to NICU to use in pumping rooms.  Maternal Data    Feeding    LATCH Score/Interventions                      Lactation Tools Discussed/Used Tools: Pump Breast pump type: Double-Electric Breast Pump   Consult Status Consult Status: PRN    Andres Labrum 08/20/2016, 12:34 PM

## 2016-08-21 ENCOUNTER — Telehealth: Payer: Self-pay | Admitting: Obstetrics

## 2016-08-26 ENCOUNTER — Ambulatory Visit: Payer: Self-pay

## 2016-08-26 NOTE — Lactation Note (Signed)
This note was copied from a baby's chart. Lactation Consultation Note  Patient Name: Boy Tillman AbideMarissa Krauser ZOXWR'UToday's Date: 08/26/2016 Reason for consult: Follow-up assessment;NICU baby Mom and baby roomed in last night.  Mom is pumping and has a good milk supply.  When I arrived mom was attempting to latch baby to breast.  Baby would grasp but then not able to sustain latch.  24 mm nipple shield applied and filled immediately with milk.  Baby latched easily and fed well with good swallows noted.  Mom also shown cradle hold.  Explained to mom if breast softens and baby content no need to supplement feed.  Questions answered.  Lactation outpatient services and support information reviewed and encouraged.  Maternal Data    Feeding Feeding Type: Breast Fed Nipple Type: Slow - flow Length of feed: 15 min  LATCH Score/Interventions Latch: Grasps breast easily, tongue down, lips flanged, rhythmical sucking. (with 24 mm nipple shield)  Audible Swallowing: Spontaneous and intermittent  Type of Nipple: Everted at rest and after stimulation  Comfort (Breast/Nipple): Soft / non-tender     Hold (Positioning): Assistance needed to correctly position infant at breast and maintain latch. Intervention(s): Breastfeeding basics reviewed;Support Pillows;Position options  LATCH Score: 9  Lactation Tools Discussed/Used Tools: Nipple Shields Nipple shield size: 24   Consult Status Consult Status: Complete    Akari Crysler S 08/26/2016, 11:31 AM

## 2016-09-14 ENCOUNTER — Encounter: Payer: Self-pay | Admitting: Certified Nurse Midwife

## 2016-09-14 ENCOUNTER — Ambulatory Visit (INDEPENDENT_AMBULATORY_CARE_PROVIDER_SITE_OTHER): Payer: Medicaid Other | Admitting: Certified Nurse Midwife

## 2016-09-14 DIAGNOSIS — Z3202 Encounter for pregnancy test, result negative: Secondary | ICD-10-CM | POA: Diagnosis not present

## 2016-09-14 DIAGNOSIS — Z30430 Encounter for insertion of intrauterine contraceptive device: Secondary | ICD-10-CM

## 2016-09-14 DIAGNOSIS — Z1389 Encounter for screening for other disorder: Secondary | ICD-10-CM

## 2016-09-14 DIAGNOSIS — O34219 Maternal care for unspecified type scar from previous cesarean delivery: Secondary | ICD-10-CM

## 2016-09-14 DIAGNOSIS — Z30014 Encounter for initial prescription of intrauterine contraceptive device: Secondary | ICD-10-CM | POA: Insufficient documentation

## 2016-09-14 LAB — POCT URINE PREGNANCY: PREG TEST UR: NEGATIVE

## 2016-09-14 MED ORDER — LEVONORGESTREL 20 MCG/24HR IU IUD
INTRAUTERINE_SYSTEM | Freq: Once | INTRAUTERINE | Status: AC
  Administered 2016-09-14: 12:00:00 via INTRAUTERINE

## 2016-09-14 NOTE — Progress Notes (Signed)
Post Partum Exam  Joanne AbideMarissa Arvie is a 21 y.o. 882P2001 female who presents for a postpartum visit. She is 4 week postpartum following a spontaneous vaginal delivery, VBAC. I have fully reviewed the prenatal and intrapartum course. The delivery was at 39.5 gestational weeks.  Anesthesia: epidural. Postpartum course has been doing well. Baby's course has been doing well. Baby is feeding by breast. Bleeding thin lochia. Bowel function is normal. Bladder function is normal. Patient is not sexually active. Contraception method is abstinence. Discuss Birth Contol, would like IUD. Postpartum depression screening:neg, score 0.  The following portions of the patient's history were reviewed and updated as appropriate: allergies, current medications, past family history, past medical history, past social history, past surgical history and problem list.  Review of Systems Pertinent items are noted in HPI.    Objective:  unknown if currently breastfeeding.  General:  alert, cooperative and no distress   Breasts:  inspection negative, no nipple discharge or bleeding, no masses or nodularity palpable  Lungs: clear to auscultation bilaterally  Heart:  regular rate and rhythm, S1, S2 normal, no murmur, click, rub or gallop  Abdomen: soft, non-tender; bowel sounds normal; no masses,  no organomegaly   Vulva:  normal  Vagina: normal vagina, no discharge, exudate, lesion, or erythema  Cervix:  no cervical motion tenderness  Corpus: normal size, contour, position, consistency, mobility, non-tender  Adnexa:  normal adnexa  Rectal Exam: Not performed.        Assessment:    Normal 4 week postpartum exam. Pap smear not done at today's visit.  Pap smear: 03/18/17 due, normal pap smear 03/17/16 Plan:   1. Contraception: abstinence, IUD and IUD placed today Mirena 2. Breast feeding 3. Follow up in: 1 month, string check, annual next year in [place] or as needed.

## 2016-09-14 NOTE — Progress Notes (Signed)
IUD Procedure Note   DIAGNOSIS: Desires long-term, reversible contraception   PROCEDURE: IUD placement Performing Provider: Orvilla Cornwallachelle Arnetta Odeh CNM  Patient counseled prior to procedure. I explained risks and benefits of Mirena IUD, reviewed alternative forms of contraception. Patient stated understanding and consented to continue with procedure.   LMP: unknown, 4 weeks postpartum and breast feeding Pregnancy Test: Negative Lot #: [id number] Expiration Date: Feb 2021   IUD type: [X]  Mirena   [   ] Paragard  [   ] Candise CheLyletta   [  ]  Kyleena  PROCEDURE:  Timeout procedure was performed to ensure right patient and right site.  A bimanual exam was performed to determine the position of the uterus, retroverted. The speculum was placed. The vagina and cervix was sterilized in the usual manner and sterile technique was maintained throughout the course of the procedure. A single toothed tenaculum was applied to the posterior lip of the cervix and gentle traction applied. The depth of the uterus was sounded to 10 cm. With gentle traction on the tenaculum, the IUD was inserted to the appropriate depth and inserted without difficulty.  The string was cut to an estimated 4 cm length. Bleeding was minimal. The patient tolerated the procedure well.   Follow up: The patient tolerated the procedure well without complications.  Standard post-procedure care is explained and return precautions are given.  Orvilla Cornwallachelle Zenaida Tesar CNM

## 2016-09-17 ENCOUNTER — Encounter: Payer: Self-pay | Admitting: Certified Nurse Midwife

## 2016-10-12 ENCOUNTER — Ambulatory Visit (INDEPENDENT_AMBULATORY_CARE_PROVIDER_SITE_OTHER): Payer: Medicaid Other | Admitting: Certified Nurse Midwife

## 2016-10-12 ENCOUNTER — Encounter: Payer: Self-pay | Admitting: Certified Nurse Midwife

## 2016-10-12 VITALS — BP 118/79 | HR 91 | Ht 64.0 in | Wt 148.0 lb

## 2016-10-12 DIAGNOSIS — Z30431 Encounter for routine checking of intrauterine contraceptive device: Secondary | ICD-10-CM

## 2016-10-12 NOTE — Progress Notes (Signed)
Pt is in the office for string check, IUD place 09-14-16.

## 2016-10-12 NOTE — Progress Notes (Signed)
Subjective:    Joanne Espinoza  who presents for contraception counseling. The patient has no complaints today. The patient is sexually active. Pertinent past medical history: none.  Likes her Mirena IUD.  Is not currently bleeding with the IUD.  Is not currently checking for her strings.   The information documented in the HPI was reviewed and verified.  Menstrual History: OB History    Gravida Para Term Preterm AB Living   SAB TAB Ectopic Multiple Live Births         0 1      No LMP recorded. has Mirena IUD   Patient Active Problem List   Diagnosis Date Noted   Patient Active Problem List   Diagnosis Date Noted  . Encounter for initial prescription of intrauterine contraceptive device (IUD) 09/14/2016  . VBAC, delivered 09/14/2016  . H/O cesarean section 03/17/2016    No past medical history on file.  Past Surgical History:  Procedure Laterality Date   Past Surgical History:  Procedure Laterality Date  . CESAREAN SECTION        Current Outpatient Prescriptions:  No medication comments found. Current Outpatient Prescriptions on File Prior to Visit  Medication Sig Dispense Refill  . ibuprofen (ADVIL,MOTRIN) 600 MG tablet Take 1 tablet (600 mg total) by mouth every 6 (six) hours as needed. (Patient not taking: Reported on 09/14/2016) 30 tablet 1  . Prenat-FeAsp-Meth-FA-DHA w/o A (PRENATE PIXIE) 10-0.6-0.4-200 MG CAPS Take 1 tablet by mouth daily. (Patient not taking: Reported on 10/12/2016) 30 capsule 12  . Vitamin D, Ergocalciferol, (DRISDOL) 50000 units CAPS capsule Take 1 capsule (50,000 Units total) by mouth every 7 (seven) days. (Patient not taking: Reported on 09/14/2016) 30 capsule 2   No current facility-administered medications on file prior to visit.     Allergies  Allergen Reactions   Allergies  Allergen Reactions  . Azithromycin Rash     Social History  Substance Use Topics   Social History   Social History  . Marital status:  Single    Spouse name: N/A  . Number of children: N/A  . Years of education: N/A   Occupational History  . Not on file.   Social History Main Topics  . Smoking status: Former Games developer  . Smokeless tobacco: Never Used  . Alcohol use Yes  . Drug use: No  . Sexual activity: Yes   Other Topics Concern  . Not on file   Social History Narrative  . No narrative on file     No family history on file.     Review of Systems Constitutional: negative for weight loss Genitourinary:negative for abnormal menstrual periods and vaginal discharge   Objective:   BP 115/77   Pulse 89   Wt 161 lb (73 kg)   BMI 25.22 kg/m    General:   alert  Skin:   no rash or abnormalities  Lungs:   clear to auscultation bilaterally  Heart:   regular rate and rhythm, S1, S2 normal, no murmur, click, rub or gallop  Breasts:   deferred  Abdomen:  normal findings: no organomegaly, soft, non-tender and no hernia  Pelvis:  External genitalia: normal general appearance Urinary system: urethral meatus normal and bladder without fullness, nontender Vaginal: normal without tenderness, induration or masses Cervix: normal appearance, IUD strings present Adnexa: normal bimanual exam Uterus: anteverted and non-tender, normal size   Lab Review Urine pregnancy test Labs reviewed yes Radiologic studies reviewed  no  50% of 15 min visit spent on counseling and coordination of care.    Assessment:    21 y.o., continuing IUD, no contraindications.   Plan:   03/17/16: pap smear normal; annual exam 03/18/17.   All questions answered.   Follow up as needed or in 1 year for annual exam.

## 2016-12-14 ENCOUNTER — Emergency Department (HOSPITAL_COMMUNITY)
Admission: EM | Admit: 2016-12-14 | Discharge: 2016-12-14 | Disposition: A | Discharge status: Home / Self Care | Payer: Self-pay | Attending: Emergency Medicine | Admitting: Emergency Medicine

## 2016-12-14 ENCOUNTER — Encounter (HOSPITAL_COMMUNITY): Payer: Self-pay | Admitting: Emergency Medicine

## 2016-12-14 ENCOUNTER — Other Ambulatory Visit: Payer: Self-pay

## 2016-12-14 DIAGNOSIS — Z87891 Personal history of nicotine dependence: Secondary | ICD-10-CM | POA: Insufficient documentation

## 2016-12-14 DIAGNOSIS — N12 Tubulo-interstitial nephritis, not specified as acute or chronic: Secondary | ICD-10-CM

## 2016-12-14 DIAGNOSIS — N10 Acute tubulo-interstitial nephritis: Secondary | ICD-10-CM | POA: Insufficient documentation

## 2016-12-14 LAB — COMPREHENSIVE METABOLIC PANEL
ALK PHOS: 97 U/L (ref 38–126)
ALT: 41 U/L (ref 14–54)
ANION GAP: 6 (ref 5–15)
AST: 24 U/L (ref 15–41)
Albumin: 3.9 g/dL (ref 3.5–5.0)
BILIRUBIN TOTAL: 0.6 mg/dL (ref 0.3–1.2)
BUN: 11 mg/dL (ref 6–20)
CALCIUM: 8.9 mg/dL (ref 8.9–10.3)
CHLORIDE: 109 mmol/L (ref 101–111)
CO2: 23 mmol/L (ref 22–32)
CREATININE: 0.66 mg/dL (ref 0.44–1.00)
Glucose, Bld: 128 mg/dL — ABNORMAL HIGH (ref 65–99)
Potassium: 3.8 mmol/L (ref 3.5–5.1)
SODIUM: 138 mmol/L (ref 135–145)
Total Protein: 7.3 g/dL (ref 6.5–8.1)

## 2016-12-14 LAB — URINALYSIS, ROUTINE W REFLEX MICROSCOPIC
Bilirubin Urine: NEGATIVE
Glucose, UA: NEGATIVE mg/dL
Ketones, ur: NEGATIVE mg/dL
Nitrite: NEGATIVE
PH: 6 (ref 5.0–8.0)
Protein, ur: NEGATIVE mg/dL
SPECIFIC GRAVITY, URINE: 1.02 (ref 1.005–1.030)

## 2016-12-14 LAB — CBC
HCT: 41.5 % (ref 36.0–46.0)
Hemoglobin: 13.8 g/dL (ref 12.0–15.0)
MCH: 31 pg (ref 26.0–34.0)
MCHC: 33.3 g/dL (ref 30.0–36.0)
MCV: 93.3 fL (ref 78.0–100.0)
PLATELETS: 342 10*3/uL (ref 150–400)
RBC: 4.45 MIL/uL (ref 3.87–5.11)
RDW: 14.6 % (ref 11.5–15.5)
WBC: 11.8 10*3/uL — AB (ref 4.0–10.5)

## 2016-12-14 LAB — POC URINE PREG, ED: Preg Test, Ur: NEGATIVE

## 2016-12-14 LAB — LIPASE, BLOOD: Lipase: 23 U/L (ref 11–51)

## 2016-12-14 MED ORDER — CEPHALEXIN 500 MG PO CAPS: 500.0000 mg | ORAL_CAPSULE | Freq: Four times a day (QID) | ORAL | 0 refills | Status: AC

## 2016-12-14 MED ORDER — ACETAMINOPHEN 325 MG PO TABS: 650.0000 mg | ORAL_TABLET | Freq: Once | ORAL | Status: DC | PRN

## 2016-12-14 MED ORDER — IBUPROFEN 800 MG PO TABS
800.0000 mg | ORAL_TABLET | Freq: Once | ORAL | Status: AC
  Administered 2016-12-14: 800 mg via ORAL
  Filled 2016-12-14: qty 1

## 2016-12-14 MED ORDER — ACETAMINOPHEN 325 MG PO TABS
325.0000 mg | ORAL_TABLET | Freq: Once | ORAL | Status: AC
  Administered 2016-12-14: 325 mg via ORAL
  Filled 2016-12-14: qty 1

## 2016-12-14 NOTE — ED Triage Notes (Signed)
Pt to ER for evaluation of lower abd pain onset yesterday with nausea, denies vomiting. States pain with urination. Febrile at triage. Pt in NAD. A/o x4.

## 2016-12-14 NOTE — Discharge Instructions (Signed)
Take tylenol 2 pills 4 times a day and motrin 4 pills 3 times a day.  Drink plenty of fluids.  Return for worsening abdominal pain and inability to eat or drink or pinpoint abdominal pain in the right side. Follow up with your family doctor.

## 2016-12-14 NOTE — ED Notes (Signed)
Pt's temperature at 11:40 was 100.18F. Pt requested ginger ale.

## 2016-12-14 NOTE — ED Notes (Signed)
Pt reports taking tylenol prior to arrival. Unaware of dosage.

## 2016-12-14 NOTE — ED Provider Notes (Signed)
[facility]   CSN: [id number] Arrival date & time: 12/14/16  1027     History   Chief Complaint Chief Complaint  Patient presents with  . Abdominal Pain    HPI Joanne Espinoza is a 21 y.o. female.  21 yo F with a chief complaint of suprapubic abdominal pain.  This been going on since this morning.  Started with dysuria yesterday.  Happened with every urination.  History of prior urinary tract infections.  No noted fever until one was checked in triage.  Denies vomiting or diarrhea.  Has been able to eat and drink.  Pain is constant described as sharp.  Worse with palpation and movement.   The history is provided by the patient.  Abdominal Pain   This is a new problem. The current episode started yesterday. The problem occurs constantly. The problem has been gradually worsening. The pain is located in the suprapubic region. The quality of the pain is sharp and shooting. The pain is at a severity of 7/10. The pain is moderate. Associated symptoms include fever. Pertinent negatives include nausea, vomiting, dysuria, headaches, arthralgias and myalgias. The symptoms are aggravated by urination. Nothing relieves the symptoms.    Past Medical History:  Diagnosis Date  . Medical history non-contributory     Patient Active Problem List   Diagnosis Date Noted  . Encounter for initial prescription of intrauterine contraceptive device (IUD) 09/14/2016  . H/O cesarean section 03/17/2016    Past Surgical History:  Procedure Laterality Date  . CESAREAN SECTION      OB History    Gravida Para Term Preterm AB Living   2 2 2  0   1   SAB TAB Ectopic Multiple Live Births   0 0 0 0 1       Home Medications    Prior to Admission medications   Medication Sig Start Date End Date Taking? Authorizing Provider  cephALEXin (KEFLEX) 500 MG capsule Take 1 capsule (500 mg total) 4 (four) times daily by mouth. 12/14/16   Melene PlanFloyd, Lynnell Fiumara, DO   ibuprofen (ADVIL,MOTRIN) 600 MG tablet Take 1 tablet (600 mg total) by mouth every 6 (six) hours as needed. Patient not taking: Reported on 09/14/2016 08/20/16   Tilda BurrowFerguson, John V, MD  Prenat-FeAsp-Meth-FA-DHA w/o A (PRENATE PIXIE) 10-0.6-0.4-200 MG CAPS Take 1 tablet by mouth daily. Patient not taking: Reported on 10/12/2016 03/17/16   Orvilla Cornwallenney, Rachelle A, CNM  Vitamin D, Ergocalciferol, (DRISDOL) 50000 units CAPS capsule Take 1 capsule (50,000 Units total) by mouth every 7 (seven) days. Patient not taking: Reported on 09/14/2016 06/25/16   Roe Coombsenney, Rachelle A, CNM    Family History No family history on file.  Social History Social History   Tobacco Use  . Smoking status: Former Games developermoker  . Smokeless tobacco: Never Used  Substance Use Topics  . Alcohol use: Yes  . Drug use: No     Allergies   Azithromycin   Review of Systems Review of Systems  Constitutional: Positive for fever. Negative for chills.  HENT: Negative for congestion and rhinorrhea.   Eyes: Negative for redness and visual disturbance.  Respiratory: Negative for shortness of breath and wheezing.   Cardiovascular: Negative for chest pain and palpitations.  Gastrointestinal: Positive for abdominal pain. Negative for nausea and vomiting.  Genitourinary: Negative for dysuria and urgency.  Musculoskeletal: Negative for arthralgias and myalgias.  Skin: Negative for pallor and wound.  Neurological: Negative for dizziness and headaches.  Physical Exam Updated Vital Signs BP 132/83   Pulse (!) 109   Temp 99 F (37.2 C) (Oral)   Resp 14   SpO2 99%   Physical Exam  Constitutional: She is oriented to person, place, and time. She appears well-developed and well-nourished. No distress.  HENT:  Head: Normocephalic and atraumatic.  Eyes: EOM are normal. Pupils are equal, round, and reactive to light.  Neck: Normal range of motion. Neck supple.  Cardiovascular: Normal rate and regular rhythm. Exam reveals no gallop and  no friction rub.  No murmur heard. Pulmonary/Chest: Effort normal. She has no wheezes. She has no rales.  Abdominal: Soft. She exhibits no distension. There is tenderness in the right lower quadrant, suprapubic area and left lower quadrant.  Diffuse pain to the lower abdomen with palpation.  Worse in the suprapubic region.  Musculoskeletal: She exhibits no edema or tenderness.  Neurological: She is alert and oriented to person, place, and time.  Skin: Skin is warm and dry. She is not diaphoretic.  Psychiatric: She has a normal mood and affect. Her behavior is normal.  Nursing note and vitals reviewed.    ED Treatments / Results  Labs (all labs ordered are listed, but only abnormal results are displayed) Labs Reviewed  COMPREHENSIVE METABOLIC PANEL - Abnormal; Notable for the following components:      Result Value   Glucose, Bld 128 (*)    All other components within normal limits  CBC - Abnormal; Notable for the following components:   WBC 11.8 (*)    All other components within normal limits  URINALYSIS, ROUTINE W REFLEX MICROSCOPIC - Abnormal; Notable for the following components:   APPearance HAZY (*)    Hgb urine dipstick MODERATE (*)    Leukocytes, UA LARGE (*)    Bacteria, UA MANY (*)    Squamous Epithelial / LPF 0-5 (*)    All other components within normal limits  LIPASE, BLOOD  POC URINE PREG, ED    EKG  EKG Interpretation None       Radiology No results found.  Procedures Procedures (including critical care time)  Medications Ordered in ED Medications  acetaminophen (TYLENOL) tablet 650 mg (not administered)  ibuprofen (ADVIL,MOTRIN) tablet 800 mg (800 mg Oral Given 12/14/16 1205)  acetaminophen (TYLENOL) tablet 325 mg (325 mg Oral Given 12/14/16 1207)     Initial Impression / Assessment and Plan / ED Course  I have reviewed the triage vital signs and the nursing notes.  Pertinent labs & imaging results that were available during my care of the  patient were reviewed by me and considered in my medical decision making (see chart for details).     21 yo F with a chief complaint of dysuria and suprapubic abdominal pain.  She has very mild right lower quadrant abdominal tenderness.  Her pain is mostly in the suprapubic region.  I doubt that this is appendicitis.  I doubt that this is an infected stone.  Will treat for pyelonephritis.  Strongly encouraged the patient to return for any worsening of her symptoms, inability to eat or drink or if her pain suddenly relocates to the right lower quadrant.  12:25 PM:  I have discussed the diagnosis/risks/treatment options with the patient and believe the pt to be eligible for discharge home to follow-up with PCP. We also discussed returning to the ED immediately if new or worsening sx occur. We discussed the sx which are most concerning (e.g., sudden worsening pain, fever, inability to tolerate  by mouth) that necessitate immediate return. Medications administered to the patient during their visit and any new prescriptions provided to the patient are listed below.  Medications given during this visit Medications  acetaminophen (TYLENOL) tablet 650 mg (not administered)  ibuprofen (ADVIL,MOTRIN) tablet 800 mg (800 mg Oral Given 12/14/16 1205)  acetaminophen (TYLENOL) tablet 325 mg (325 mg Oral Given 12/14/16 1207)     The patient appears reasonably screen and/or stabilized for discharge and I doubt any other medical condition or other [facility] requiring further screening, evaluation, or treatment in the ED at this time prior to discharge.    Final Clinical Impressions(s) / ED Diagnoses   Final diagnoses:  Pyelonephritis    ED Discharge Orders        Ordered    cephALEXin (KEFLEX) 500 MG capsule  4 times daily     12/14/16 1159       Melene Plan, DO 12/14/16 1225

## 2017-03-19 ENCOUNTER — Ambulatory Visit: Payer: Self-pay | Admitting: Certified Nurse Midwife

## 2018-03-23 NOTE — Telephone Encounter (Signed)
Error

## 2018-04-17 DIAGNOSIS — M25471 Effusion, right ankle: Secondary | ICD-10-CM | POA: Diagnosis not present

## 2018-04-17 DIAGNOSIS — M25472 Effusion, left ankle: Secondary | ICD-10-CM | POA: Diagnosis not present

## 2018-04-17 DIAGNOSIS — M25571 Pain in right ankle and joints of right foot: Secondary | ICD-10-CM | POA: Diagnosis not present

## 2018-04-17 DIAGNOSIS — M7989 Other specified soft tissue disorders: Secondary | ICD-10-CM | POA: Diagnosis not present

## 2018-04-17 DIAGNOSIS — S93401A Sprain of unspecified ligament of right ankle, initial encounter: Secondary | ICD-10-CM | POA: Diagnosis not present

## 2018-05-06 DIAGNOSIS — Z13220 Encounter for screening for lipoid disorders: Secondary | ICD-10-CM | POA: Diagnosis not present

## 2018-05-06 DIAGNOSIS — Z23 Encounter for immunization: Secondary | ICD-10-CM | POA: Diagnosis not present

## 2018-05-06 DIAGNOSIS — Z0000 Encounter for general adult medical examination without abnormal findings: Secondary | ICD-10-CM | POA: Diagnosis not present

## 2018-05-06 DIAGNOSIS — Z131 Encounter for screening for diabetes mellitus: Secondary | ICD-10-CM | POA: Diagnosis not present

## 2018-05-06 IMAGING — US US OB COMP LESS 14 WK
1 series · 15 of 28 positions shown · non-contrast
Comparison: None.

CLINICAL DATA: 20 y/o  F; two weeks of pelvic pain.

EXAM:
OBSTETRIC <14 WK US AND TRANSVAGINAL OB US
TECHNIQUE: Both transabdominal and transvaginal ultrasound examinations were
performed for complete evaluation of the gestation as well as the
maternal uterus, adnexal regions, and pelvic cul-de-sac.
Transvaginal technique was performed to assess early pregnancy.

[Series 1: us ob comp less 14 wk · 15 of 60 slices shown]
[im 1/60]
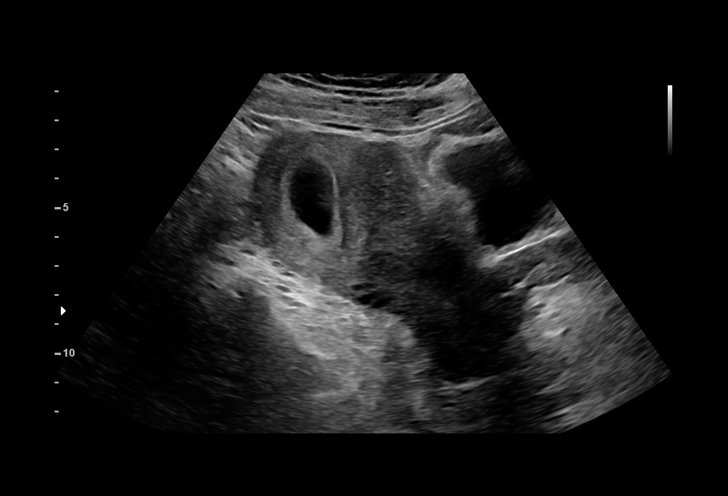
[im 5/60]
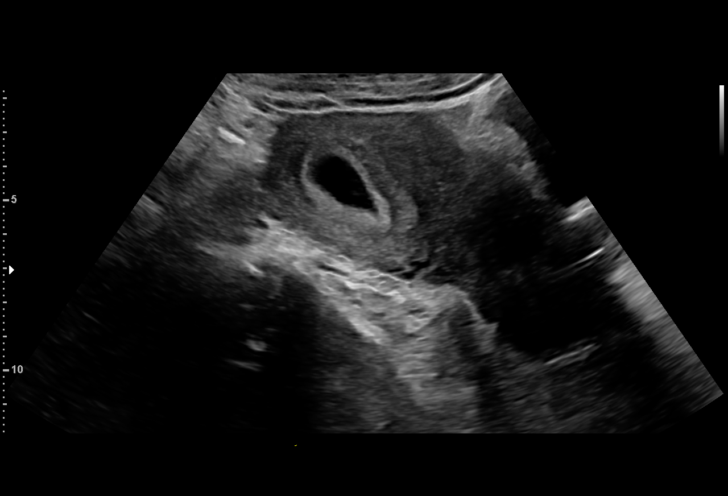
[im 9/60]
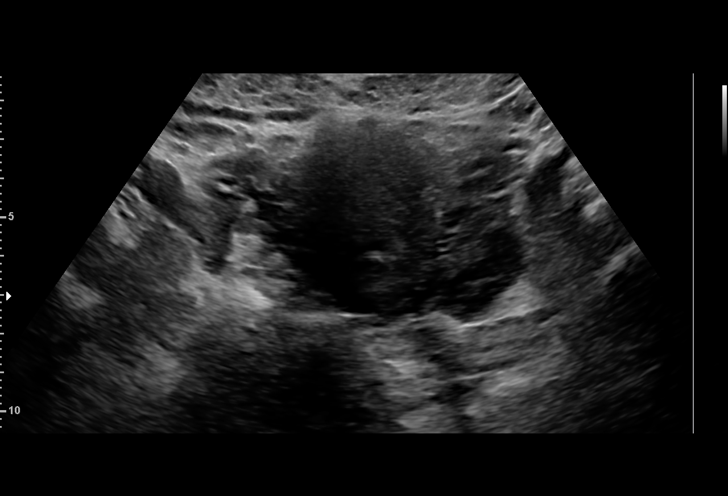
[im 14/60]
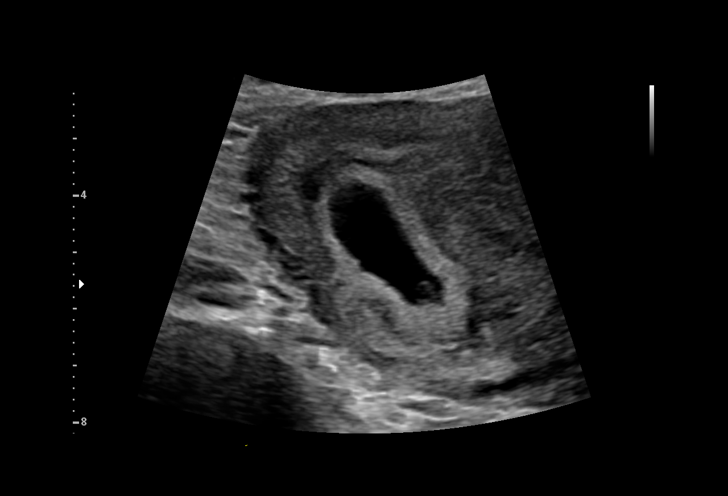
[im 18/60]
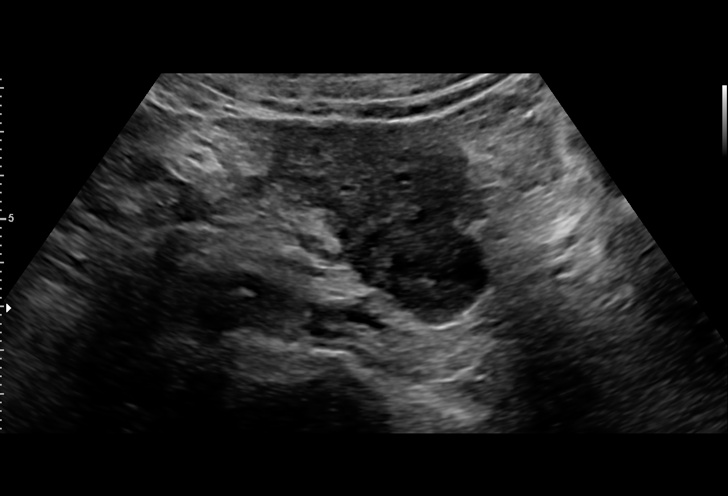
[im 22/60]
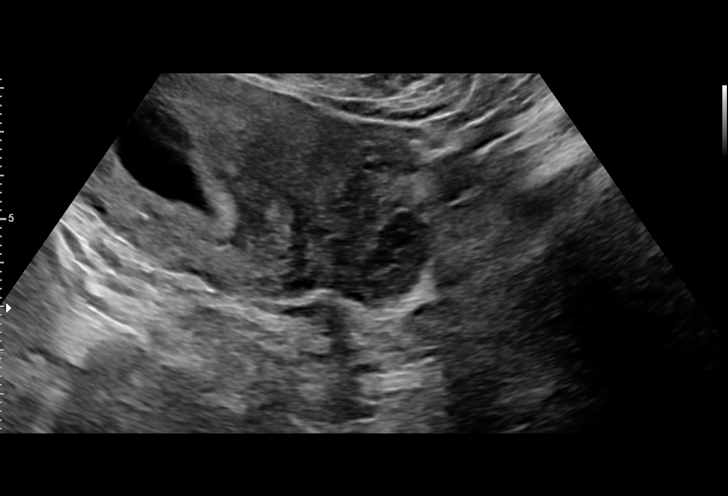
[im 27/60]
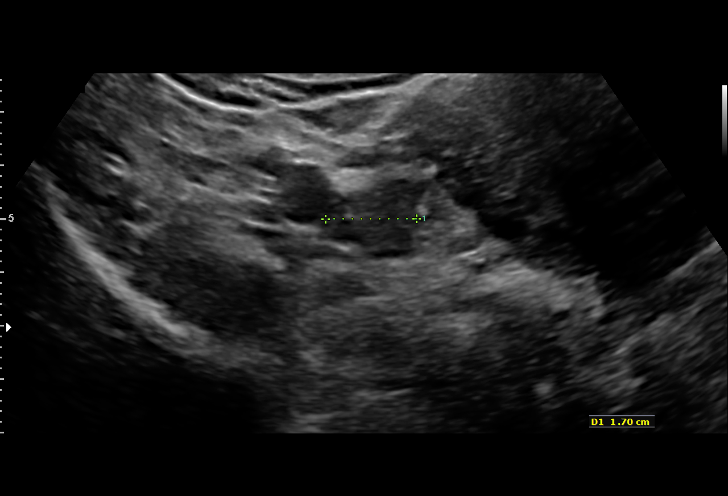
[im 31/60]
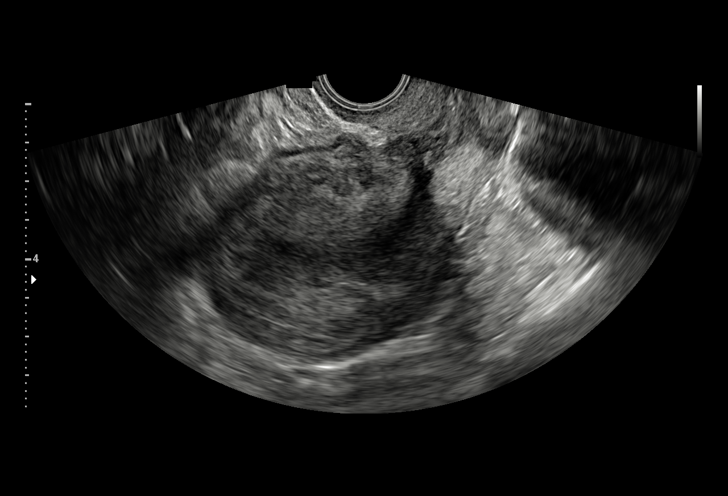
[im 33/60]
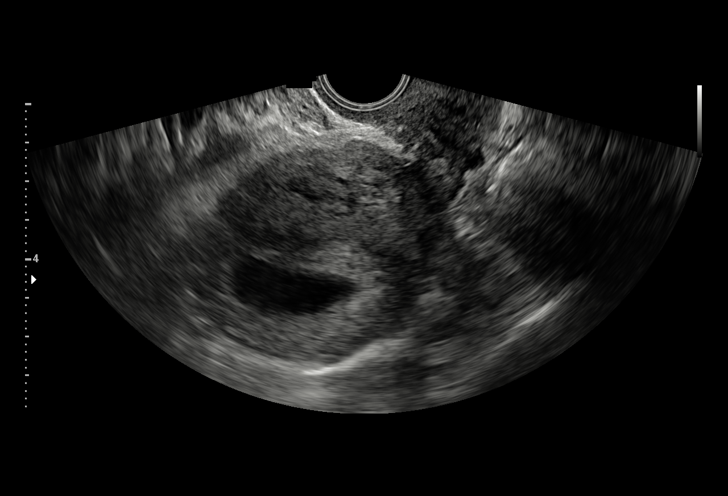
[im 38/60]
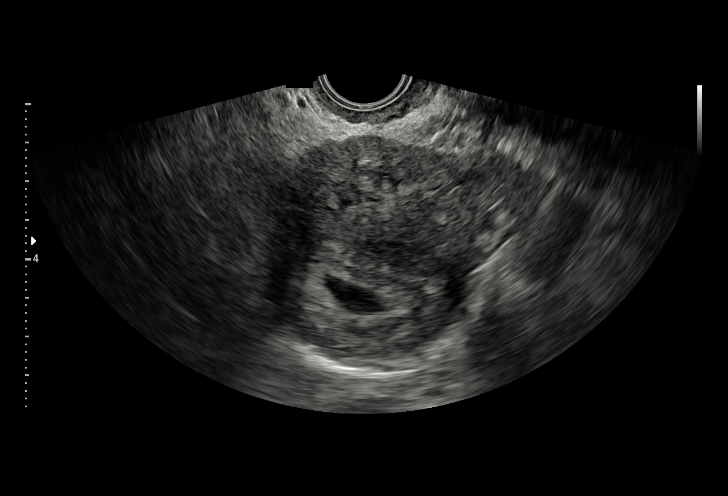
[im 42/60]
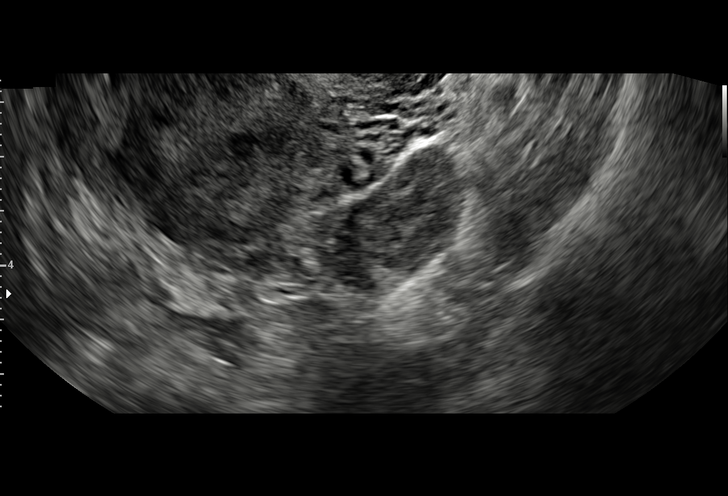
[im 46/60]
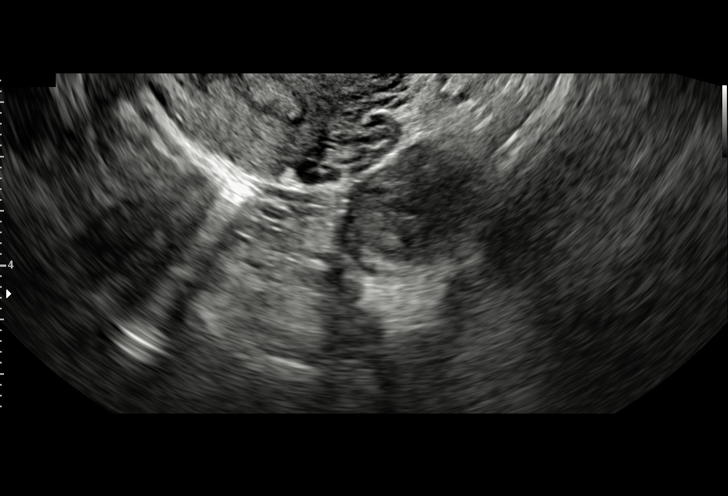
[im 51/60]
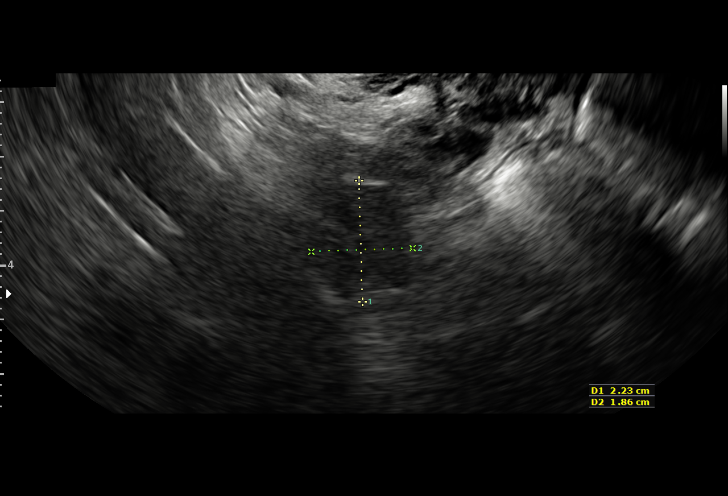
[im 55/60]
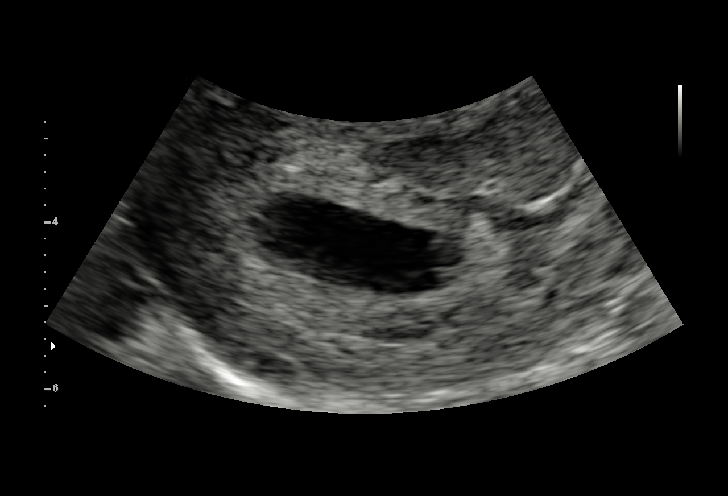
[im 60/60]
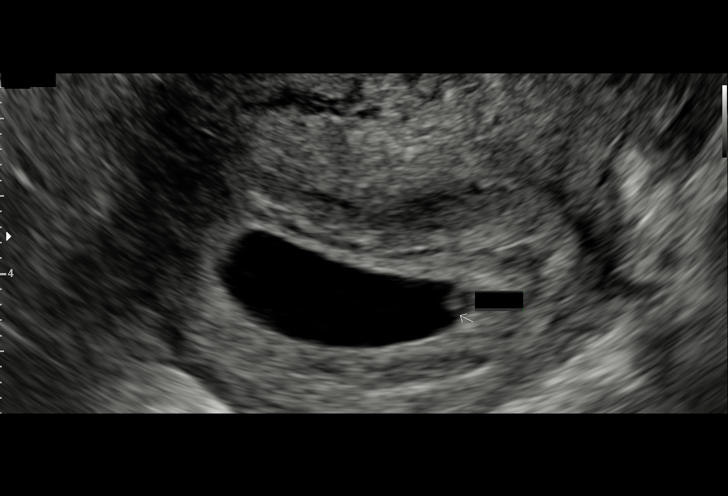

[15 of 28 positions shown; findings below may reference images not displayed]

FINDINGS: Intrauterine gestational sac: Single

Yolk sac:  Visualized.

Embryo:  Visualized.

Cardiac Activity: Visualized.

Heart Rate: 115  bpm

CRL:  3.1  mm   5 w   6 d                  US EDC: 08/27/2016

Subchorionic hemorrhage:  None visualized.

Maternal uterus/adnexae: Normal.
IMPRESSION: Single live intrauterine pregnancy with estimated gestational age of
5 weeks and 6 days by crown-rump length.

By: Falmaatan Siyume M.D.

## 2018-06-24 DIAGNOSIS — Z309 Encounter for contraceptive management, unspecified: Secondary | ICD-10-CM | POA: Diagnosis not present

## 2018-06-29 DIAGNOSIS — R102 Pelvic and perineal pain: Secondary | ICD-10-CM | POA: Diagnosis not present

## 2018-09-08 IMAGING — US US MFM OB FOLLOW-UP
1 series · 14 of 28 positions shown · non-contrast
Comparison: none

[Series 1: us mfm ob follow-up · 14 of 53 slices shown]
[im 2/53]
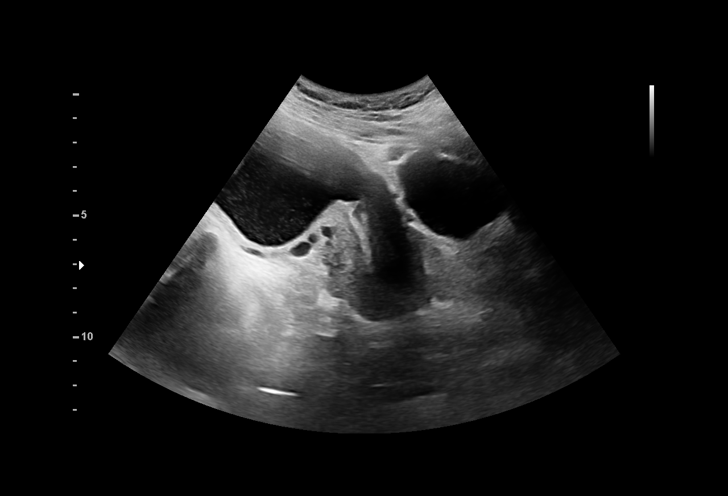
[im 6/53]
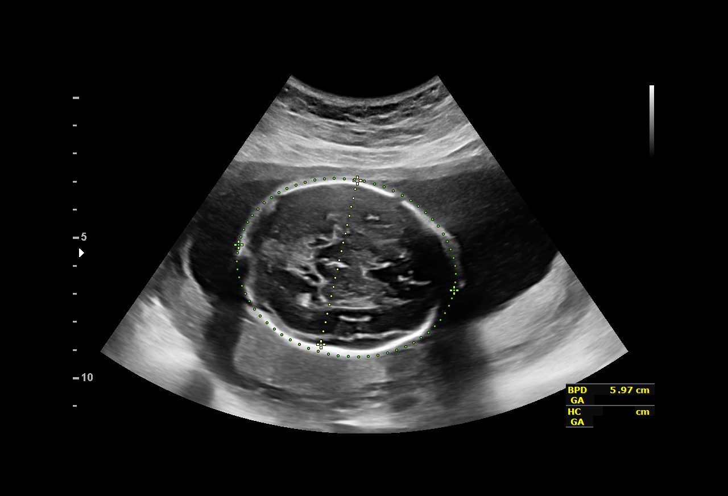
[im 10/53]
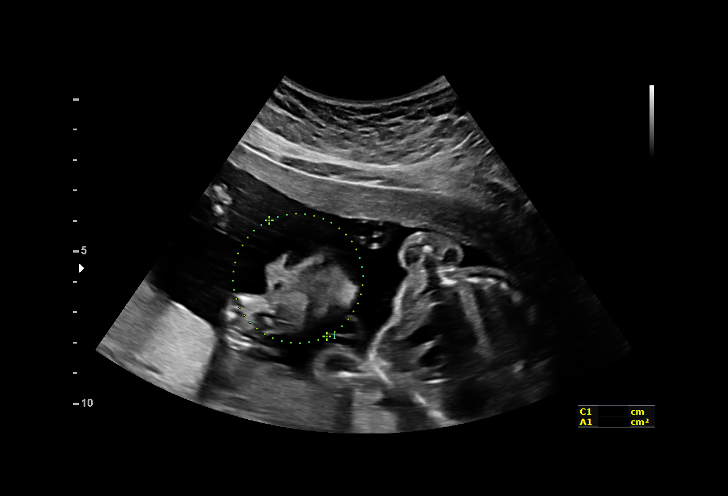
[im 14/53]
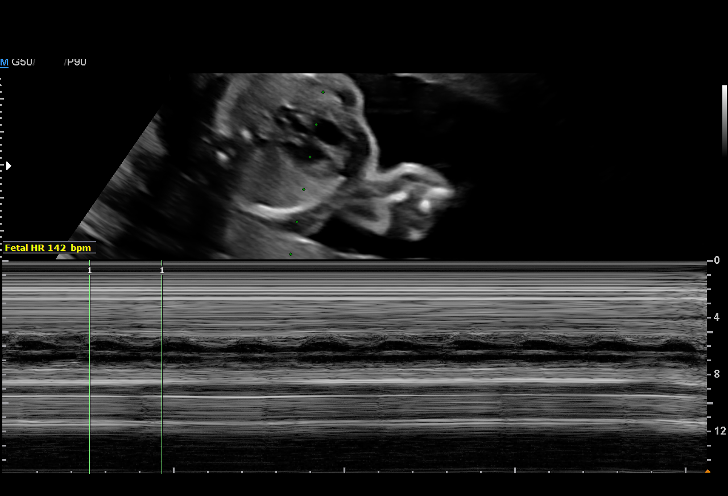
[im 18/53]
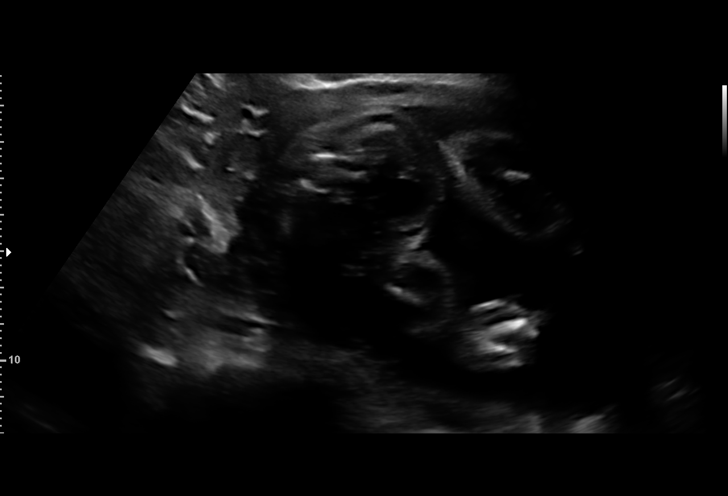
[im 22/53]
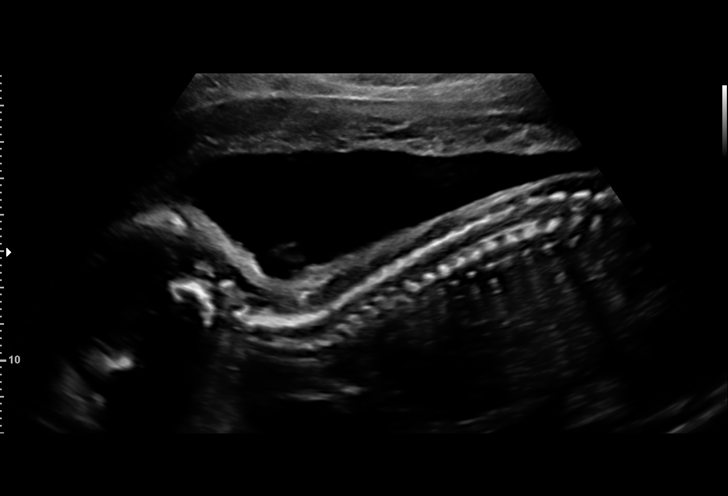
[im 26/53]
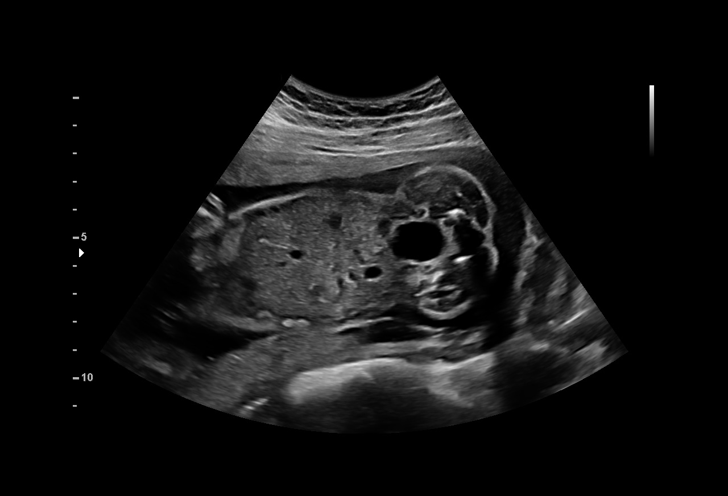
[im 29/53]
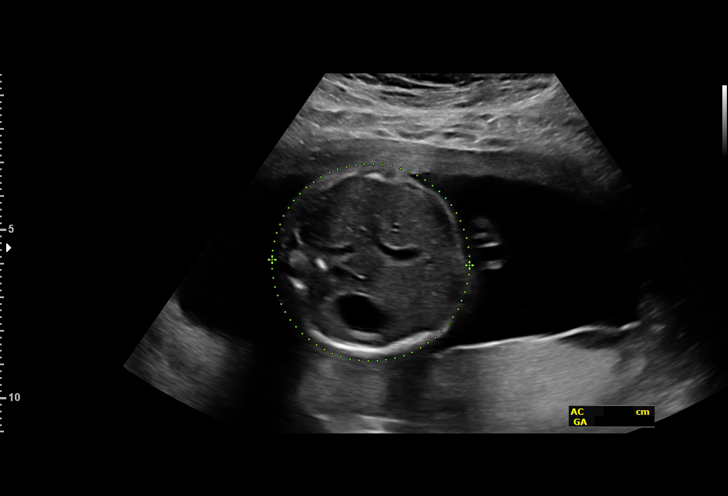
[im 33/53]
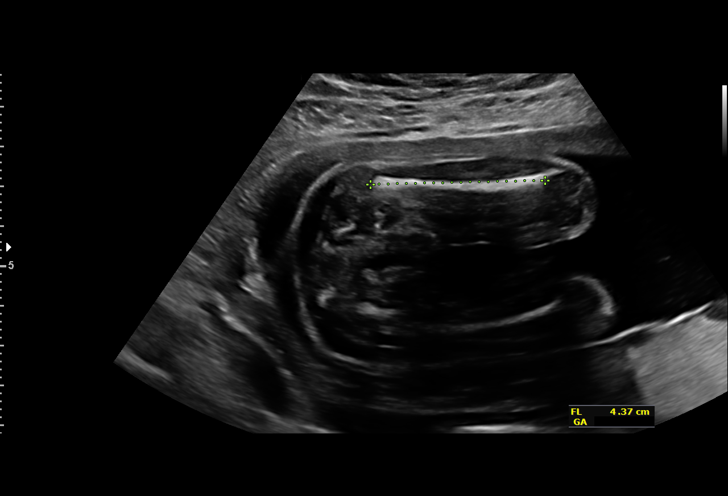
[im 37/53]
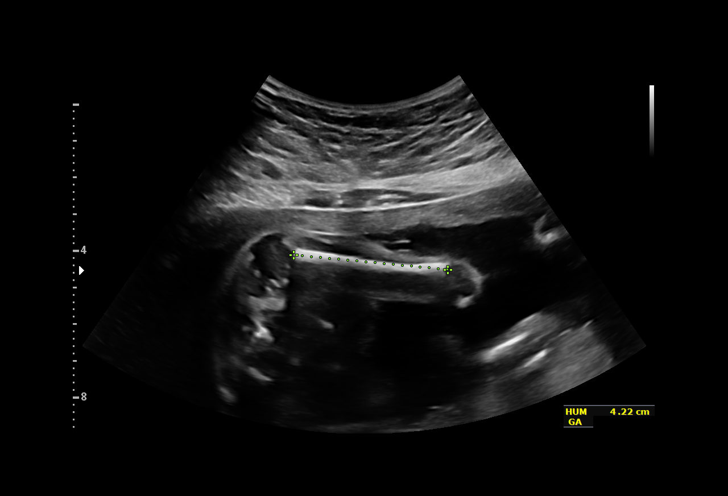
[im 41/53]
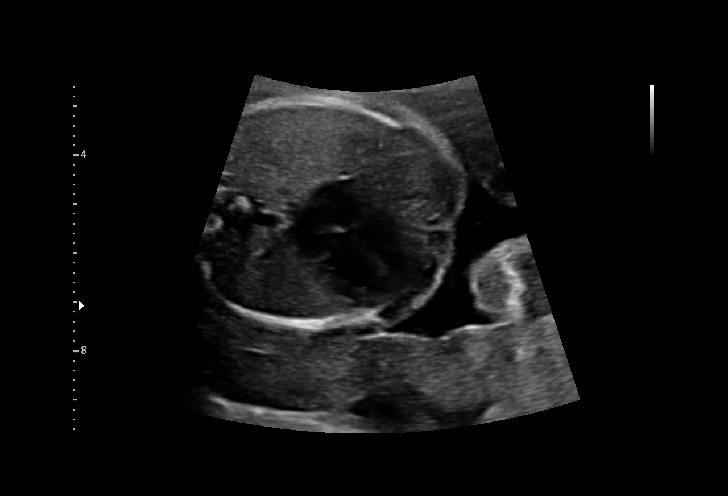
[im 45/53]
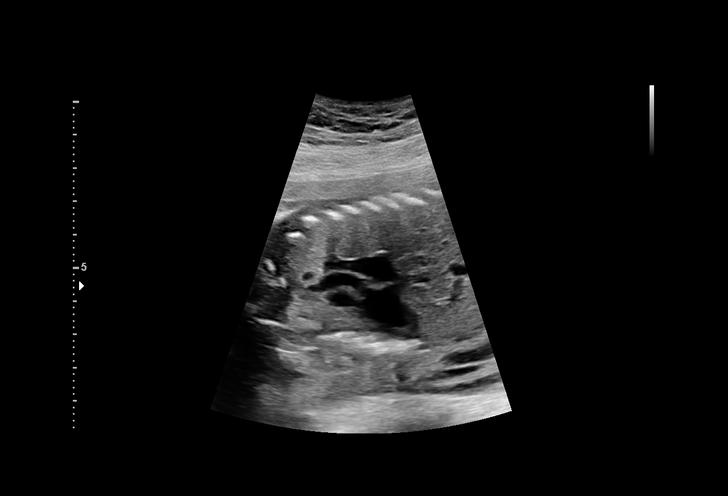
[im 49/53]
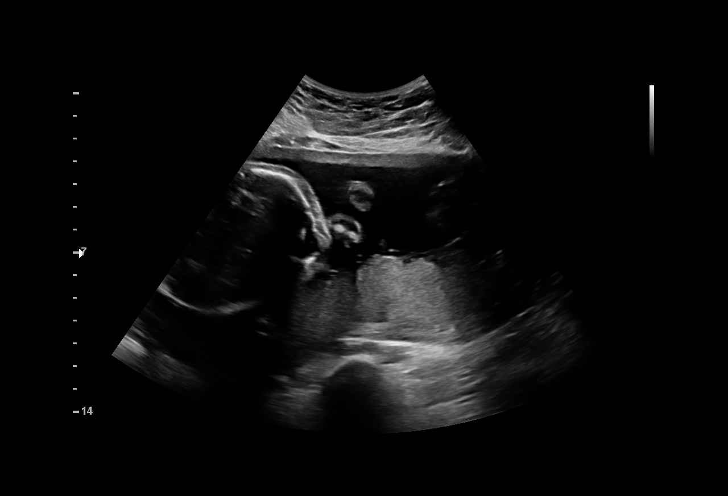
[im 53/53]
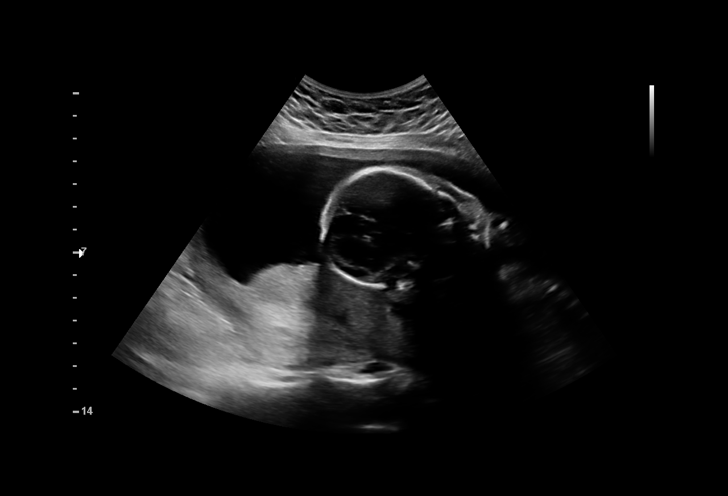

[14 of 28 positions shown; findings below may reference images not displayed]

Road [HOSPITAL]

Indications

24 weeks gestation of pregnancy
Encounter for other antenatal screening
follow-up
History of cesarean delivery, currently
pregnant
OB History

Gravidity:    2         Term:   1        Prem:   0        SAB:   0
TOP:          0       Ectopic:  0        Living: 1
Fetal Evaluation

Num Of Fetuses:     1
Fetal Heart         142
Rate(bpm):
Cardiac Activity:   Observed
Presentation:       Breech
Placenta:           Posterior, above cervical os
P. Cord Insertion:  Previously Visualized

Amniotic Fluid
AFI FV:      Subjectively within normal limits

Largest Pocket(cm)
4.80
Biometry

BPD:      60.3  mm     G. Age:  24w 4d         37  %    CI:        72.98   %   70 - 86
FL/HC:      19.6   %   18.7 -
HC:      224.4  mm     G. Age:  24w 3d         22  %    HC/AC:      1.20       1.04 -
AC:      187.3  mm     G. Age:  23w 4d         11  %    FL/BPD:     72.8   %   71 - 87
FL:       43.9  mm     G. Age:  24w 3d         28  %    FL/AC:      23.4   %   20 - 24
HUM:      42.1  mm     G. Age:  25w 2d         58  %

Est. FW:     650  gm      1 lb 7 oz     36  %
Gestational Age

LMP:           24w 5d       Date:   11/14/15                 EDD:   08/20/16
U/S Today:     24w 2d                                        EDD:   08/23/16
Best:          24w 5d    Det. By:   LMP  (11/14/15)          EDD:   08/20/16
Anatomy

Cranium:               Appears normal         Aortic Arch:            Previously seen
Cavum:                 Appears normal         Ductal Arch:            Previously seen
Ventricles:            Appears normal         Diaphragm:              Appears normal
Choroid Plexus:        Previously seen        Stomach:                Appears normal, left
sided
Cerebellum:            Previously seen        Abdomen:                Appears normal
Posterior Fossa:       Previously seen        Abdominal Wall:         Previously seen
Nuchal Fold:           Previously seen        Cord Vessels:           Previously seen
Face:                  Orbits and profile     Kidneys:                Appear normal
previously seen
Lips:                  Appears normal         Bladder:                Appears normal
Thoracic:              Appears normal         Spine:                  Appears normal
Heart:                 Appears normal         Upper Extremities:      Previously seen
(4CH, axis, and situs
RVOT:                  Previously seen        Lower Extremities:      Previously seen
LVOT:                  Appears normal

Other:  Fetus appears to be a male previously seen. Heels and 5th digit
previously visualized. Nasal bone previously visualized. Technically
difficult due to fetal position.
Cervix Uterus Adnexa

Cervix
Length:            5.1  cm.
Normal appearance by transabdominal scan.

Uterus
No abnormality visualized.

Left Ovary
Not visualized.

Right Ovary
Not visualized.
Impression

Single living intrauterine pregnancy at 95w6d.
Appropriate fetal growth (36%).
Normal amniotic fluid volume.
The fetal anatomic survey is complete.
Normal fetal anatomy.
No fetal anomalies or soft markers of aneuploidy seen.
Recommendations

Follow-up ultrasounds as clinically indicated.

## 2022-12-09 DIAGNOSIS — Z124 Encounter for screening for malignant neoplasm of cervix: Secondary | ICD-10-CM | POA: Diagnosis not present

## 2022-12-09 DIAGNOSIS — Z01419 Encounter for gynecological examination (general) (routine) without abnormal findings: Secondary | ICD-10-CM | POA: Diagnosis not present

## 2022-12-09 DIAGNOSIS — Z23 Encounter for immunization: Secondary | ICD-10-CM | POA: Diagnosis not present
